# Patient Record
Sex: Male | Born: 1955 | Race: White | Hispanic: No | Marital: Married | Smoking: Never smoker
Health system: Southern US, Community
[De-identification: ages and names within clinical notes are randomized; demographics above are authoritative.]

## PROBLEM LIST (undated history)

## (undated) DIAGNOSIS — G4730 Sleep apnea, unspecified: Secondary | ICD-10-CM

## (undated) DIAGNOSIS — M1990 Unspecified osteoarthritis, unspecified site: Secondary | ICD-10-CM

## (undated) DIAGNOSIS — I10 Essential (primary) hypertension: Secondary | ICD-10-CM

## (undated) HISTORY — PX: KNEE ARTHROSCOPY: SHX127

## (undated) HISTORY — PX: JOINT REPLACEMENT: SHX530

---

## 2005-03-13 ENCOUNTER — Ambulatory Visit: Payer: Self-pay | Admitting: Pain Medicine

## 2005-03-19 ENCOUNTER — Ambulatory Visit: Payer: Self-pay | Admitting: Pain Medicine

## 2005-04-24 ENCOUNTER — Ambulatory Visit: Payer: Self-pay | Admitting: Pain Medicine

## 2005-05-07 ENCOUNTER — Ambulatory Visit: Payer: Self-pay | Admitting: Pain Medicine

## 2005-06-25 ENCOUNTER — Ambulatory Visit: Payer: Self-pay | Admitting: Pain Medicine

## 2005-07-22 ENCOUNTER — Ambulatory Visit: Payer: Self-pay | Admitting: Pain Medicine

## 2005-07-24 ENCOUNTER — Encounter: Admission: RE | Admit: 2005-07-24 | Discharge: 2005-07-24 | Payer: Self-pay | Admitting: Neurosurgery

## 2005-10-28 ENCOUNTER — Ambulatory Visit: Payer: Self-pay | Admitting: Pain Medicine

## 2005-11-03 ENCOUNTER — Ambulatory Visit: Payer: Self-pay | Admitting: Pain Medicine

## 2005-11-18 ENCOUNTER — Ambulatory Visit: Payer: Self-pay | Admitting: Pain Medicine

## 2005-11-24 ENCOUNTER — Ambulatory Visit: Payer: Self-pay | Admitting: Pain Medicine

## 2005-12-11 ENCOUNTER — Ambulatory Visit: Payer: Self-pay | Admitting: Pain Medicine

## 2005-12-15 ENCOUNTER — Ambulatory Visit: Payer: Self-pay | Admitting: Pain Medicine

## 2006-01-15 ENCOUNTER — Ambulatory Visit: Payer: Self-pay | Admitting: Pain Medicine

## 2006-01-21 ENCOUNTER — Ambulatory Visit: Payer: Self-pay | Admitting: Pain Medicine

## 2006-02-12 ENCOUNTER — Ambulatory Visit: Payer: Self-pay | Admitting: Pain Medicine

## 2006-03-17 ENCOUNTER — Ambulatory Visit: Payer: Self-pay | Admitting: Pain Medicine

## 2006-03-25 ENCOUNTER — Ambulatory Visit: Payer: Self-pay | Admitting: Pain Medicine

## 2006-04-16 ENCOUNTER — Ambulatory Visit: Payer: Self-pay | Admitting: Pain Medicine

## 2006-04-27 ENCOUNTER — Ambulatory Visit: Payer: Self-pay | Admitting: Pain Medicine

## 2006-05-20 ENCOUNTER — Ambulatory Visit: Payer: Self-pay | Admitting: Pain Medicine

## 2006-05-26 ENCOUNTER — Ambulatory Visit: Payer: Self-pay | Admitting: Pain Medicine

## 2006-06-15 ENCOUNTER — Ambulatory Visit: Payer: Self-pay | Admitting: Pain Medicine

## 2006-07-15 ENCOUNTER — Emergency Department: Payer: Self-pay | Admitting: Emergency Medicine

## 2006-08-03 ENCOUNTER — Ambulatory Visit: Payer: Self-pay | Admitting: Pain Medicine

## 2006-09-03 ENCOUNTER — Ambulatory Visit: Payer: Self-pay | Admitting: Pain Medicine

## 2012-03-03 ENCOUNTER — Other Ambulatory Visit: Payer: Self-pay | Admitting: Unknown Physician Specialty

## 2012-03-03 DIAGNOSIS — M25561 Pain in right knee: Secondary | ICD-10-CM

## 2012-03-10 ENCOUNTER — Ambulatory Visit
Admission: RE | Admit: 2012-03-10 | Discharge: 2012-03-10 | Disposition: A | Discharge status: Home / Self Care | Payer: 59 | Source: Ambulatory Visit | Attending: Unknown Physician Specialty | Admitting: Unknown Physician Specialty

## 2012-03-10 DIAGNOSIS — M25561 Pain in right knee: Secondary | ICD-10-CM

## 2012-05-17 ENCOUNTER — Ambulatory Visit: Payer: Self-pay | Admitting: Unknown Physician Specialty

## 2014-10-02 DIAGNOSIS — I10 Essential (primary) hypertension: Secondary | ICD-10-CM | POA: Diagnosis not present

## 2014-11-14 DIAGNOSIS — G4733 Obstructive sleep apnea (adult) (pediatric): Secondary | ICD-10-CM | POA: Diagnosis not present

## 2015-02-28 DIAGNOSIS — E785 Hyperlipidemia, unspecified: Secondary | ICD-10-CM | POA: Diagnosis not present

## 2015-02-28 DIAGNOSIS — Z79899 Other long term (current) drug therapy: Secondary | ICD-10-CM | POA: Diagnosis not present

## 2015-03-05 DIAGNOSIS — M545 Low back pain: Secondary | ICD-10-CM | POA: Diagnosis not present

## 2015-03-05 DIAGNOSIS — I10 Essential (primary) hypertension: Secondary | ICD-10-CM | POA: Diagnosis not present

## 2015-03-05 DIAGNOSIS — Z125 Encounter for screening for malignant neoplasm of prostate: Secondary | ICD-10-CM | POA: Diagnosis not present

## 2015-05-15 DIAGNOSIS — G4733 Obstructive sleep apnea (adult) (pediatric): Secondary | ICD-10-CM | POA: Diagnosis not present

## 2015-06-02 DIAGNOSIS — J019 Acute sinusitis, unspecified: Secondary | ICD-10-CM | POA: Diagnosis not present

## 2015-06-02 DIAGNOSIS — R05 Cough: Secondary | ICD-10-CM | POA: Diagnosis not present

## 2015-07-17 DIAGNOSIS — S93402A Sprain of unspecified ligament of left ankle, initial encounter: Secondary | ICD-10-CM | POA: Diagnosis not present

## 2015-07-19 DIAGNOSIS — S93402A Sprain of unspecified ligament of left ankle, initial encounter: Secondary | ICD-10-CM | POA: Diagnosis not present

## 2015-07-26 DIAGNOSIS — M25572 Pain in left ankle and joints of left foot: Secondary | ICD-10-CM | POA: Diagnosis not present

## 2015-07-26 DIAGNOSIS — S93402D Sprain of unspecified ligament of left ankle, subsequent encounter: Secondary | ICD-10-CM | POA: Diagnosis not present

## 2015-09-04 DIAGNOSIS — Z125 Encounter for screening for malignant neoplasm of prostate: Secondary | ICD-10-CM | POA: Diagnosis not present

## 2015-09-04 DIAGNOSIS — I10 Essential (primary) hypertension: Secondary | ICD-10-CM | POA: Diagnosis not present

## 2015-09-14 DIAGNOSIS — Z0000 Encounter for general adult medical examination without abnormal findings: Secondary | ICD-10-CM | POA: Diagnosis not present

## 2015-12-25 DIAGNOSIS — M1712 Unilateral primary osteoarthritis, left knee: Secondary | ICD-10-CM | POA: Diagnosis not present

## 2016-01-31 DIAGNOSIS — M12562 Traumatic arthropathy, left knee: Secondary | ICD-10-CM | POA: Diagnosis not present

## 2016-03-11 DIAGNOSIS — M12562 Traumatic arthropathy, left knee: Secondary | ICD-10-CM | POA: Diagnosis not present

## 2016-03-11 DIAGNOSIS — M1712 Unilateral primary osteoarthritis, left knee: Secondary | ICD-10-CM | POA: Diagnosis not present

## 2016-03-19 DIAGNOSIS — M1712 Unilateral primary osteoarthritis, left knee: Secondary | ICD-10-CM | POA: Diagnosis not present

## 2016-06-30 DIAGNOSIS — M1712 Unilateral primary osteoarthritis, left knee: Secondary | ICD-10-CM | POA: Diagnosis not present

## 2016-07-01 DIAGNOSIS — G4733 Obstructive sleep apnea (adult) (pediatric): Secondary | ICD-10-CM | POA: Diagnosis not present

## 2016-09-10 DIAGNOSIS — H524 Presbyopia: Secondary | ICD-10-CM | POA: Diagnosis not present

## 2016-09-17 DIAGNOSIS — Z79899 Other long term (current) drug therapy: Secondary | ICD-10-CM | POA: Diagnosis not present

## 2016-09-25 DIAGNOSIS — I10 Essential (primary) hypertension: Secondary | ICD-10-CM | POA: Diagnosis not present

## 2016-09-25 DIAGNOSIS — Z6841 Body Mass Index (BMI) 40.0 and over, adult: Secondary | ICD-10-CM | POA: Diagnosis not present

## 2016-09-25 DIAGNOSIS — Z125 Encounter for screening for malignant neoplasm of prostate: Secondary | ICD-10-CM | POA: Diagnosis not present

## 2016-09-25 DIAGNOSIS — M1712 Unilateral primary osteoarthritis, left knee: Secondary | ICD-10-CM | POA: Diagnosis not present

## 2016-09-25 DIAGNOSIS — M545 Low back pain: Secondary | ICD-10-CM | POA: Diagnosis not present

## 2016-09-25 DIAGNOSIS — Z0000 Encounter for general adult medical examination without abnormal findings: Secondary | ICD-10-CM | POA: Diagnosis not present

## 2016-09-25 DIAGNOSIS — E785 Hyperlipidemia, unspecified: Secondary | ICD-10-CM | POA: Diagnosis not present

## 2016-09-25 DIAGNOSIS — G8929 Other chronic pain: Secondary | ICD-10-CM | POA: Diagnosis not present

## 2016-10-02 DIAGNOSIS — Z125 Encounter for screening for malignant neoplasm of prostate: Secondary | ICD-10-CM | POA: Diagnosis not present

## 2016-10-02 DIAGNOSIS — E785 Hyperlipidemia, unspecified: Secondary | ICD-10-CM | POA: Diagnosis not present

## 2016-10-02 DIAGNOSIS — I10 Essential (primary) hypertension: Secondary | ICD-10-CM | POA: Diagnosis not present

## 2016-10-07 NOTE — Patient Instructions (Signed)
  Your procedure is scheduled on: 10-15-16 [facility](WEDNESDAY) Report to Same Day Surgery 2nd floor medical mall [facility](Medical Mall Entrance-take elevator on left to 2nd floor.  Check in with surgery information desk.) To find out your arrival time please call [phone] between 1PM - 3PM on 10-14-16 (TUESDAY)  Remember: Instructions that are not followed completely may result in serious medical risk, up to and including death, or upon the discretion of your surgeon and anesthesiologist your surgery may need to be rescheduled.    _x___ 1. Do not eat food or drink liquids after midnight. No gum chewing or hard candies.     __x__ 2. No Alcohol for 24 hours before or after surgery.   __x__3. No Smoking for 24 prior to surgery.   ____  4. Bring all medications with you on the day of surgery if instructed.    __x__ 5. Notify your doctor if there is any change in your medical condition     (cold, fever, infections).     Do not wear jewelry, make-up, hairpins, clips or nail polish.  Do not wear lotions, powders, or perfumes. You may wear deodorant.  Do not shave 48 hours prior to surgery. Men may shave face and neck.  Do not bring valuables to the hospital.    [facility] not responsible for any belongings or valuables.               Contacts, dentures or bridgework may not be worn into surgery.  Leave your suitcase in the car. After surgery it may be brought to your room.  For patients admitted to the hospital, discharge time is determined by your treatment team.   Patients discharged the day of surgery will not be allowed to drive home.  You will need someone to drive you home and stay with you the night of your procedure.    Please read over the following fact sheets that you were given:   [facility] Pre[facility] and or MRSA Information   _x___ Take these medicines the morning of surgery with A SIP OF WATER:    1. METHADONE  2.  3.  4.  5.  6.  ____Fleets enema or Magnesium  Citrate as directed.   _x___ Use CHG Soap or sage wipes as directed on instruction sheet   ____ Use inhalers on the day of surgery and bring to hospital day of surgery  ____ Stop metformin 2 days prior to surgery    ____ Take 1/2 of usual insulin dose the night before surgery and none on the morning of   surgery.   ____ Stop Aspirin, Coumadin, Pllavix ,Eliquis, Effient, or Pradaxa  x__ Stop Anti-inflammatories such as Advil, Aleve, Ibuprofen, Motrin, Naproxen,          Naprosyn, Goodies powders or aspirin products NOW-Ok to take Tylenol.   ____ Stop supplements until after surgery.    _X___ Bring C-Pap to the hospital.

## 2016-10-09 ENCOUNTER — Encounter
Admission: RE | Admit: 2016-10-09 | Discharge: 2016-10-09 | Disposition: A | Discharge status: Home / Self Care | Payer: Medicare HMO | Source: Ambulatory Visit | Attending: Unknown Physician Specialty | Admitting: Unknown Physician Specialty

## 2016-10-09 DIAGNOSIS — I10 Essential (primary) hypertension: Secondary | ICD-10-CM | POA: Insufficient documentation

## 2016-10-09 HISTORY — DX: Unspecified osteoarthritis, unspecified site: M19.90

## 2016-10-09 HISTORY — DX: Sleep apnea, unspecified: G47.30

## 2016-10-09 HISTORY — DX: Essential (primary) hypertension: I10

## 2016-10-15 ENCOUNTER — Ambulatory Visit: Payer: Medicare HMO | Admitting: Anesthesiology

## 2016-10-15 ENCOUNTER — Encounter
Admission: RE | Disposition: A | Discharge status: Home / Self Care | Payer: Self-pay | Source: Ambulatory Visit | Attending: Unknown Physician Specialty

## 2016-10-15 ENCOUNTER — Encounter: Payer: Self-pay | Admitting: *Deleted

## 2016-10-15 ENCOUNTER — Ambulatory Visit
Admission: RE | Admit: 2016-10-15 | Discharge: 2016-10-15 | Disposition: A | Discharge status: Home / Self Care | Payer: Medicare HMO | Source: Ambulatory Visit | Attending: Unknown Physician Specialty | Admitting: Unknown Physician Specialty

## 2016-10-15 DIAGNOSIS — Z96651 Presence of right artificial knee joint: Secondary | ICD-10-CM | POA: Insufficient documentation

## 2016-10-15 DIAGNOSIS — Z472 Encounter for removal of internal fixation device: Secondary | ICD-10-CM | POA: Insufficient documentation

## 2016-10-15 DIAGNOSIS — G4730 Sleep apnea, unspecified: Secondary | ICD-10-CM | POA: Insufficient documentation

## 2016-10-15 DIAGNOSIS — I10 Essential (primary) hypertension: Secondary | ICD-10-CM | POA: Diagnosis not present

## 2016-10-15 DIAGNOSIS — Z6841 Body Mass Index (BMI) 40.0 and over, adult: Secondary | ICD-10-CM | POA: Diagnosis not present

## 2016-10-15 DIAGNOSIS — S82122S Displaced fracture of lateral condyle of left tibia, sequela: Secondary | ICD-10-CM | POA: Diagnosis not present

## 2016-10-15 DIAGNOSIS — M1712 Unilateral primary osteoarthritis, left knee: Secondary | ICD-10-CM | POA: Insufficient documentation

## 2016-10-15 HISTORY — PX: HARDWARE REMOVAL: SHX979

## 2016-10-15 SURGERY — REMOVAL, HARDWARE
Anesthesia: Spinal | Site: Knee | Laterality: Left

## 2016-10-15 MED ORDER — NEOMYCIN-POLYMYXIN B GU 40-200000 IR SOLN
Status: DC | PRN
  Administered 2016-10-15: 2 mL

## 2016-10-15 MED ORDER — PROPOFOL 500 MG/50ML IV EMUL
INTRAVENOUS | Status: DC | PRN
  Administered 2016-10-15: 25 ug/kg/min via INTRAVENOUS

## 2016-10-15 MED ORDER — ONDANSETRON HCL 4 MG/2ML IJ SOLN
INTRAMUSCULAR | Status: DC | PRN
  Administered 2016-10-15: 4 mg via INTRAVENOUS

## 2016-10-15 MED ORDER — OXYCODONE HCL 10 MG PO TABS: 10.0000 mg | ORAL_TABLET | Freq: Four times a day (QID) | ORAL | 0 refills | Status: DC | PRN

## 2016-10-15 MED ORDER — CEFAZOLIN SODIUM 1 G IJ SOLR
INTRAMUSCULAR | Status: DC | PRN
  Administered 2016-10-15: 2 g

## 2016-10-15 MED ORDER — PROPOFOL 10 MG/ML IV BOLUS
INTRAVENOUS | Status: AC
  Filled 2016-10-15: qty 20

## 2016-10-15 MED ORDER — LIDOCAINE HCL (CARDIAC) 20 MG/ML IV SOLN
INTRAVENOUS | Status: DC | PRN
  Administered 2016-10-15: 20 mg via INTRAVENOUS

## 2016-10-15 MED ORDER — ONDANSETRON HCL 4 MG/2ML IJ SOLN: 4.0000 mg | Freq: Once | INTRAMUSCULAR | Status: DC | PRN

## 2016-10-15 MED ORDER — SEVOFLURANE IN SOLN
RESPIRATORY_TRACT | Status: AC
  Filled 2016-10-15: qty 250

## 2016-10-15 MED ORDER — DEXAMETHASONE SODIUM PHOSPHATE 10 MG/ML IJ SOLN
INTRAMUSCULAR | Status: DC | PRN
  Administered 2016-10-15: 5 mg via INTRAVENOUS

## 2016-10-15 MED ORDER — BUPIVACAINE HCL (PF) 0.5 % IJ SOLN
INTRAMUSCULAR | Status: DC | PRN
  Administered 2016-10-15: 1.5 mL via INTRATHECAL

## 2016-10-15 MED ORDER — NEOMYCIN-POLYMYXIN B GU 40-200000 IR SOLN
Status: AC
  Filled 2016-10-15: qty 2

## 2016-10-15 MED ORDER — LIDOCAINE HCL (PF) 2 % IJ SOLN
INTRAMUSCULAR | Status: AC
  Filled 2016-10-15: qty 2

## 2016-10-15 MED ORDER — DEXAMETHASONE SODIUM PHOSPHATE 10 MG/ML IJ SOLN
INTRAMUSCULAR | Status: AC
  Filled 2016-10-15: qty 1

## 2016-10-15 MED ORDER — CEFAZOLIN SODIUM 1 G IJ SOLR
INTRAMUSCULAR | Status: AC
  Filled 2016-10-15: qty 10

## 2016-10-15 MED ORDER — PROPOFOL 10 MG/ML IV BOLUS
INTRAVENOUS | Status: DC | PRN
  Administered 2016-10-15 (×2): 20 mg via INTRAVENOUS

## 2016-10-15 MED ORDER — FENTANYL CITRATE (PF) 100 MCG/2ML IJ SOLN
INTRAMUSCULAR | Status: AC
  Filled 2016-10-15: qty 2

## 2016-10-15 MED ORDER — BUPIVACAINE HCL (PF) 0.5 % IJ SOLN
INTRAMUSCULAR | Status: DC | PRN
  Administered 2016-10-15: 10 mL

## 2016-10-15 MED ORDER — LACTATED RINGERS IV SOLN
INTRAVENOUS | Status: DC
  Administered 2016-10-15: 07:00:00 via INTRAVENOUS

## 2016-10-15 MED ORDER — GLYCOPYRROLATE 0.2 MG/ML IJ SOLN
INTRAMUSCULAR | Status: AC
Start: 2016-10-15 — End: 2016-10-15
  Filled 2016-10-15: qty 1

## 2016-10-15 MED ORDER — FENTANYL CITRATE (PF) 100 MCG/2ML IJ SOLN
INTRAMUSCULAR | Status: DC | PRN
  Administered 2016-10-15: 100 ug via INTRAVENOUS

## 2016-10-15 MED ORDER — GLYCOPYRROLATE 0.2 MG/ML IJ SOLN
INTRAMUSCULAR | Status: DC | PRN
  Administered 2016-10-15: .2 mg via INTRAVENOUS

## 2016-10-15 MED ORDER — BUPIVACAINE IN DEXTROSE 0.75-8.25 % IT SOLN
INTRATHECAL | Status: DC | PRN
  Administered 2016-10-15: 1.2 mL via INTRATHECAL

## 2016-10-15 MED ORDER — MIDAZOLAM HCL 5 MG/5ML IJ SOLN
INTRAMUSCULAR | Status: DC | PRN
  Administered 2016-10-15: 2 mg via INTRAVENOUS

## 2016-10-15 MED ORDER — FENTANYL CITRATE (PF) 100 MCG/2ML IJ SOLN: 25.0000 ug | INTRAMUSCULAR | Status: DC | PRN

## 2016-10-15 MED ORDER — MIDAZOLAM HCL 2 MG/2ML IJ SOLN
INTRAMUSCULAR | Status: AC
  Filled 2016-10-15: qty 2

## 2016-10-15 MED ORDER — FAMOTIDINE 20 MG PO TABS
20.0000 mg | ORAL_TABLET | Freq: Once | ORAL | Status: AC
  Administered 2016-10-15: 20 mg via ORAL

## 2016-10-15 MED ORDER — BUPIVACAINE HCL (PF) 0.5 % IJ SOLN
INTRAMUSCULAR | Status: AC
  Filled 2016-10-15: qty 30

## 2016-10-15 SURGICAL SUPPLY — 47 items
BLADE SURG SZ10 CARB STEEL (BLADE) ×2 IMPLANT
BNDG ESMARK 6X12 TAN STRL LF (GAUZE/BANDAGES/DRESSINGS) ×1 IMPLANT
CANISTER SUCT 1200ML W/VALVE (MISCELLANEOUS) ×2 IMPLANT
CUFF TOURN 30 STER DUAL PORT (MISCELLANEOUS) IMPLANT
CUFF TOURN 34 STER (MISCELLANEOUS) ×1 IMPLANT
DRAPE FLUOR MINI C-ARM 54X84 (DRAPES) ×1 IMPLANT
DRAPE INCISE IOBAN 66X45 STRL (DRAPES) ×1 IMPLANT
DRAPE U-SHAPE 47X51 STRL (DRAPES) ×1 IMPLANT
DURAPREP 26ML APPLICATOR (WOUND CARE) ×3 IMPLANT
GAUZE PETRO XEROFOAM 1X8 (MISCELLANEOUS) ×1 IMPLANT
GAUZE SPONGE 4X4 12PLY STRL (GAUZE/BANDAGES/DRESSINGS) ×2 IMPLANT
GAUZE XEROFORM 4X4 STRL (GAUZE/BANDAGES/DRESSINGS) ×1 IMPLANT
GLOVE BIO SURGEON STRL SZ7 (GLOVE) ×3 IMPLANT
GLOVE BIO SURGEON STRL SZ7.5 (GLOVE) ×1 IMPLANT
GLOVE BIOGEL M STRL SZ7.5 (GLOVE) ×2 IMPLANT
GLOVE INDICATOR 8.0 STRL GRN (GLOVE) ×2 IMPLANT
GLOVE SURG ORTHO 8.0 STRL STRW (GLOVE) ×2 IMPLANT
GOWN STRL REUS W/ TWL LRG LVL3 (GOWN DISPOSABLE) ×1 IMPLANT
GOWN STRL REUS W/TWL LRG LVL3 (GOWN DISPOSABLE) ×2
GOWN STRL REUS W/TWL LRG LVL4 (GOWN DISPOSABLE) ×2 IMPLANT
HANDLE YANKAUER SUCT BULB TIP (MISCELLANEOUS) ×1 IMPLANT
KIT RM TURNOVER STRD PROC AR (KITS) ×2 IMPLANT
NDL FILTER BLUNT 18X1 1/2 (NEEDLE) IMPLANT
NEEDLE FILTER BLUNT 18X 1/2SAF (NEEDLE) ×1
NEEDLE FILTER BLUNT 18X1 1/2 (NEEDLE) ×1 IMPLANT
NS IRRIG 500ML POUR BTL (IV SOLUTION) ×2 IMPLANT
PACK EXTREMITY ARMC (MISCELLANEOUS) ×2 IMPLANT
PAD ABD DERMACEA PRESS 5X9 (GAUZE/BANDAGES/DRESSINGS) ×3 IMPLANT
PAD CAST CTTN 4X4 STRL (SOFTGOODS) ×1 IMPLANT
PAD GROUND ADULT SPLIT (MISCELLANEOUS) ×2 IMPLANT
PADDING CAST COTTON 4X4 STRL (SOFTGOODS) ×4
SOL PREP PVP 2OZ (MISCELLANEOUS) ×2
SOLUTION PREP PVP 2OZ (MISCELLANEOUS) ×1 IMPLANT
SPONGE LAP 18X18 5 PK (GAUZE/BANDAGES/DRESSINGS) ×1 IMPLANT
STAPLER SKIN PROX 35W (STAPLE) ×1 IMPLANT
STOCKINETTE BIAS CUT 6 980064 (GAUZE/BANDAGES/DRESSINGS) ×2 IMPLANT
STOCKINETTE STRL 6IN 960660 (GAUZE/BANDAGES/DRESSINGS) ×2 IMPLANT
SUT ETHILON 3-0 FS-10 30 BLK (SUTURE) ×2
SUT VIC AB 0 CT1 36 (SUTURE) ×2 IMPLANT
SUT VIC AB 2-0 CT1 27 (SUTURE) ×2
SUT VIC AB 2-0 CT1 TAPERPNT 27 (SUTURE) IMPLANT
SUT VIC AB 2-0 SH 27 (SUTURE) ×2
SUT VIC AB 2-0 SH 27XBRD (SUTURE) ×1 IMPLANT
SUTURE EHLN 3-0 FS-10 30 BLK (SUTURE) ×1 IMPLANT
SYR 3ML LL SCALE MARK (SYRINGE) ×1 IMPLANT
TAPE MICROFOAM 4IN (TAPE) ×1 IMPLANT
WRAP KNEE W/COLD PACKS 25.5X14 (SOFTGOODS) ×1 IMPLANT

## 2016-10-15 NOTE — Anesthesia Procedure Notes (Signed)
Spinal  Patient location during procedure: OR Staffing Performed: anesthesiologist  Preanesthetic Checklist Completed: patient identified, site marked, surgical consent, pre-op evaluation, timeout performed, IV checked, risks and benefits discussed and monitors and equipment checked Spinal Block Patient position: sitting Prep: Betadine Patient monitoring: heart rate, continuous pulse ox, blood pressure and cardiac monitor Approach: midline Location: L4-5 Injection technique: single-shot Needle Needle type: Whitacre and Introducer  Needle gauge: 24 G Needle length: 10 cm Assessment Sensory level: T6 Additional Notes Negative paresthesia. Negative blood return. Positive free-flowing CSF. Expiration date of kit checked and confirmed. Patient tolerated procedure well, without complications.

## 2016-10-15 NOTE — Transfer of Care (Signed)
Immediate Anesthesia Transfer of Care Note  Patient: Thomas Pruitt  Procedure(s) Performed: Procedure(s): HARDWARE REMOVAL (Left)  Patient Location: PACU  Anesthesia Type:General and Spinal  Level of Consciousness: awake, alert  and oriented  Airway & Oxygen Therapy: Patient Spontanous Breathing and Patient connected to face mask oxygen  Post-op Assessment: Report given to RN, Post -op Vital signs reviewed and stable and Patient moving all extremities X 4  Post vital signs: Reviewed and stable  Last Vitals:  Vitals:   10/15/16 0656 10/15/16 0958  BP: 116/78   Pulse: (!) 103   Resp: 16   Temp: 36.5 C 36.4 C    Last Pain:  Vitals:   10/15/16 0958  TempSrc: Temporal  PainSc:          Complications: No apparent anesthesia complications

## 2016-10-15 NOTE — OR Nursing (Signed)
Patient here for hardware removal from left knee. Explanted four screws and one plate from left knee.

## 2016-10-15 NOTE — Anesthesia Post-op Follow-up Note (Cosign Needed)
Anesthesia QCDR form completed.        

## 2016-10-15 NOTE — Op Note (Signed)
10/15/2016  2:34 PM  PATIENT:  Thomas Pruitt  61 y.o. 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 Over time the patient had developed fairly severe tricompartmental arthritis. Patient was brought in for removal of his osteotomy fixation device in preparation for a future left total knee replacement.  OP NOTE: The patient was taken the operating room where satisfactory spinal anesthesia was achieved. Patient was placed in a supine position. Tourniquet was applied to his left proximal thigh. The left lower extremity was then prepped and draped in usual fashion for procedure about the knee. The patient was given 2 g of Kefzol IV shortly after the start of the procedure. The left lower extremity was exsanguinated and the tourniquet was inflated. The patient's remote anterolateral proximal tibial incision was re-incised. Dissection was carried down through the soft tissue onto the L-shaped osteotomy plate. I was able to use an osteotome to remove some bony overgrowth. I was then able to remove the proximal and distal screws. The plate was then elevated off of the lateral proximal tibia. I closed the proximal anterolateral muscle fascia with #1 Vicryl sutures. The tourniquet was released at this time. It had been up about 59 minutes. There was minimal bleeding. The subcutaneous tissue was closed with 2-0 Vicryl. The skin was closed with skin staples. I applied a sterile compression dressing. The patient was then transferred to his stretcher bed. He was taken to the recovery room in satisfactory condition.

## 2016-10-15 NOTE — Discharge Instructions (Addendum)
AMBULATORY SURGERY  DISCHARGE INSTRUCTIONS   1) The drugs that you were given will stay in your system until tomorrow so for the next 24 hours you should not:  A) Drive an automobile B) Make any legal decisions C) Drink any alcoholic beverage   2) You may resume regular meals tomorrow.  Today it is better to start with liquids and gradually work up to solid foods.  You may eat anything you prefer, but it is better to start with liquids, then soup and crackers, and gradually work up to solid foods.   3) Please notify your doctor immediately if you have any unusual bleeding, trouble breathing, redness and pain at the surgery site, drainage, fever, or pain not relieved by medication.  4) Your post-operative visit with Dr.                                     is: Date:                        Time:    Please call to schedule your post-operative visit.  5) Additional Instructions: Ice pack  Elevation  WBAT with cane or crutch  RTC in about 1 wk.   Keep dressing dry

## 2016-10-15 NOTE — Anesthesia Procedure Notes (Signed)
Date/Time: 10/15/2016 8:05 AM Performed by: Derinda LateIACONE, Sholom Dulude Pre-anesthesia Checklist: Timeout performed, Patient being monitored, Suction available, Emergency Drugs available and Patient identified Patient Re-evaluated:Patient Re-evaluated prior to inductionOxygen Delivery Method: Simple face mask Preoxygenation: Pre-oxygenation with 100% oxygen Placement Confirmation: positive ETCO2

## 2016-10-15 NOTE — OR Nursing (Signed)
Bladder scanner shows 434 to 456 ml of urine - pt stood up  And urine began leaking on to floor - sheet was wet under pt.

## 2016-10-15 NOTE — Anesthesia Preprocedure Evaluation (Signed)
Anesthesia Evaluation  Patient identified by MRN, date of birth, ID band Patient awake    Reviewed: Allergy & Precautions, H&P , NPO status , Patient's Chart, lab work & pertinent test results, reviewed documented beta blocker date and time   Airway Mallampati: III   Neck ROM: full    Dental  (+) Poor Dentition, Teeth Intact   Pulmonary neg pulmonary ROS, sleep apnea ,    Pulmonary exam normal        Cardiovascular hypertension, negative cardio ROS Normal cardiovascular exam Rhythm:regular Rate:Normal     Neuro/Psych negative neurological ROS  negative psych ROS   GI/Hepatic negative GI ROS, Neg liver ROS,   Endo/Other  negative endocrine ROSMorbid obesity  Renal/GU negative Renal ROS  negative genitourinary   Musculoskeletal   Abdominal   Peds  Hematology negative hematology ROS (+)   Anesthesia Other Findings Past Medical History: No date: Arthritis No date: Hypertension No date: Sleep apnea     Comment: CPAP Past Surgical History: No date: JOINT REPLACEMENT Right     Comment: PARTIAL No date: KNEE ARTHROSCOPY Left     Comment: X2 BMI    Body Mass Index:  44.92 kg/m     Reproductive/Obstetrics negative OB ROS                             Anesthesia Physical Anesthesia Plan  ASA: III  Anesthesia Plan: Spinal   Post-op Pain Management:    Induction:   Airway Management Planned:   Additional Equipment:   Intra-op Plan:   Post-operative Plan:   Informed Consent: I have reviewed the patients History and Physical, chart, labs and discussed the procedure including the risks, benefits and alternatives for the proposed anesthesia with the patient or authorized representative who has indicated his/her understanding and acceptance.   Dental Advisory Given  Plan Discussed with: CRNA  Anesthesia Plan Comments:         Anesthesia Quick Evaluation

## 2016-10-15 NOTE — OR Nursing (Signed)
Iv dcd left hand for discharge - site without redness catheter intact

## 2016-10-15 NOTE — H&P (Signed)
  H and P reviewed. No changes. Uploaded at later date. 

## 2016-10-16 NOTE — Anesthesia Postprocedure Evaluation (Signed)
Anesthesia Post Note  Patient: Thomas Pruitt  Procedure(s) Performed: Procedure(s) (LRB): HARDWARE REMOVAL (Left)  Patient location during evaluation: Nursing Unit Anesthesia Type: Spinal Level of consciousness: awake, awake and alert and oriented Pain management: pain level controlled Vital Signs Assessment: post-procedure vital signs reviewed and stable Respiratory status: spontaneous breathing, nonlabored ventilation and respiratory function stable Cardiovascular status: blood pressure returned to baseline and stable Postop Assessment: no headache and no backache Anesthetic complications: no     Last Vitals:  Vitals:   10/15/16 1256 10/15/16 1405  BP: 138/86 128/85  Pulse: 67 67  Resp: 16 16  Temp:      Last Pain:  Vitals:   10/15/16 1038  TempSrc: Tympanic  PainSc:                  Lyn RecordsNoles,  Thomas Ringel R

## 2017-01-05 DIAGNOSIS — Z01812 Encounter for preprocedural laboratory examination: Secondary | ICD-10-CM | POA: Diagnosis not present

## 2017-01-05 DIAGNOSIS — Z01818 Encounter for other preprocedural examination: Secondary | ICD-10-CM | POA: Diagnosis not present

## 2017-01-05 DIAGNOSIS — M1712 Unilateral primary osteoarthritis, left knee: Secondary | ICD-10-CM | POA: Diagnosis not present

## 2017-01-09 DIAGNOSIS — M1712 Unilateral primary osteoarthritis, left knee: Secondary | ICD-10-CM | POA: Diagnosis not present

## 2017-01-19 DIAGNOSIS — Z79891 Long term (current) use of opiate analgesic: Secondary | ICD-10-CM | POA: Diagnosis not present

## 2017-01-19 DIAGNOSIS — G8918 Other acute postprocedural pain: Secondary | ICD-10-CM | POA: Diagnosis not present

## 2017-01-19 DIAGNOSIS — H1189 Other specified disorders of conjunctiva: Secondary | ICD-10-CM | POA: Diagnosis not present

## 2017-01-19 DIAGNOSIS — M1712 Unilateral primary osteoarthritis, left knee: Secondary | ICD-10-CM | POA: Diagnosis not present

## 2017-01-19 DIAGNOSIS — I10 Essential (primary) hypertension: Secondary | ICD-10-CM | POA: Diagnosis not present

## 2017-01-19 DIAGNOSIS — N400 Enlarged prostate without lower urinary tract symptoms: Secondary | ICD-10-CM | POA: Diagnosis not present

## 2017-01-19 DIAGNOSIS — G4700 Insomnia, unspecified: Secondary | ICD-10-CM | POA: Diagnosis not present

## 2017-01-19 DIAGNOSIS — M25562 Pain in left knee: Secondary | ICD-10-CM | POA: Diagnosis not present

## 2017-01-19 DIAGNOSIS — G4733 Obstructive sleep apnea (adult) (pediatric): Secondary | ICD-10-CM | POA: Diagnosis not present

## 2017-01-23 DIAGNOSIS — G8929 Other chronic pain: Secondary | ICD-10-CM | POA: Diagnosis not present

## 2017-01-23 DIAGNOSIS — M549 Dorsalgia, unspecified: Secondary | ICD-10-CM | POA: Diagnosis not present

## 2017-01-23 DIAGNOSIS — M1991 Primary osteoarthritis, unspecified site: Secondary | ICD-10-CM | POA: Diagnosis not present

## 2017-01-23 DIAGNOSIS — Z471 Aftercare following joint replacement surgery: Secondary | ICD-10-CM | POA: Diagnosis not present

## 2017-01-23 DIAGNOSIS — I10 Essential (primary) hypertension: Secondary | ICD-10-CM | POA: Diagnosis not present

## 2017-01-23 DIAGNOSIS — Z96652 Presence of left artificial knee joint: Secondary | ICD-10-CM | POA: Diagnosis not present

## 2017-01-26 DIAGNOSIS — Z471 Aftercare following joint replacement surgery: Secondary | ICD-10-CM | POA: Diagnosis not present

## 2017-01-26 DIAGNOSIS — G8929 Other chronic pain: Secondary | ICD-10-CM | POA: Diagnosis not present

## 2017-01-26 DIAGNOSIS — Z96652 Presence of left artificial knee joint: Secondary | ICD-10-CM | POA: Diagnosis not present

## 2017-01-26 DIAGNOSIS — M1991 Primary osteoarthritis, unspecified site: Secondary | ICD-10-CM | POA: Diagnosis not present

## 2017-01-26 DIAGNOSIS — M549 Dorsalgia, unspecified: Secondary | ICD-10-CM | POA: Diagnosis not present

## 2017-01-26 DIAGNOSIS — I10 Essential (primary) hypertension: Secondary | ICD-10-CM | POA: Diagnosis not present

## 2017-01-28 DIAGNOSIS — G8929 Other chronic pain: Secondary | ICD-10-CM | POA: Diagnosis not present

## 2017-01-28 DIAGNOSIS — M549 Dorsalgia, unspecified: Secondary | ICD-10-CM | POA: Diagnosis not present

## 2017-01-28 DIAGNOSIS — I10 Essential (primary) hypertension: Secondary | ICD-10-CM | POA: Diagnosis not present

## 2017-01-28 DIAGNOSIS — Z471 Aftercare following joint replacement surgery: Secondary | ICD-10-CM | POA: Diagnosis not present

## 2017-01-28 DIAGNOSIS — M1991 Primary osteoarthritis, unspecified site: Secondary | ICD-10-CM | POA: Diagnosis not present

## 2017-01-28 DIAGNOSIS — Z96652 Presence of left artificial knee joint: Secondary | ICD-10-CM | POA: Diagnosis not present

## 2017-01-30 DIAGNOSIS — M1991 Primary osteoarthritis, unspecified site: Secondary | ICD-10-CM | POA: Diagnosis not present

## 2017-01-30 DIAGNOSIS — Z471 Aftercare following joint replacement surgery: Secondary | ICD-10-CM | POA: Diagnosis not present

## 2017-01-30 DIAGNOSIS — M549 Dorsalgia, unspecified: Secondary | ICD-10-CM | POA: Diagnosis not present

## 2017-01-30 DIAGNOSIS — G8929 Other chronic pain: Secondary | ICD-10-CM | POA: Diagnosis not present

## 2017-01-30 DIAGNOSIS — Z96652 Presence of left artificial knee joint: Secondary | ICD-10-CM | POA: Diagnosis not present

## 2017-01-30 DIAGNOSIS — I10 Essential (primary) hypertension: Secondary | ICD-10-CM | POA: Diagnosis not present

## 2017-02-03 DIAGNOSIS — M1991 Primary osteoarthritis, unspecified site: Secondary | ICD-10-CM | POA: Diagnosis not present

## 2017-02-03 DIAGNOSIS — Z96652 Presence of left artificial knee joint: Secondary | ICD-10-CM | POA: Diagnosis not present

## 2017-02-03 DIAGNOSIS — I10 Essential (primary) hypertension: Secondary | ICD-10-CM | POA: Diagnosis not present

## 2017-02-03 DIAGNOSIS — Z471 Aftercare following joint replacement surgery: Secondary | ICD-10-CM | POA: Diagnosis not present

## 2017-02-03 DIAGNOSIS — G8929 Other chronic pain: Secondary | ICD-10-CM | POA: Diagnosis not present

## 2017-02-03 DIAGNOSIS — M549 Dorsalgia, unspecified: Secondary | ICD-10-CM | POA: Diagnosis not present

## 2017-02-04 DIAGNOSIS — Z471 Aftercare following joint replacement surgery: Secondary | ICD-10-CM | POA: Diagnosis not present

## 2017-02-04 DIAGNOSIS — G8929 Other chronic pain: Secondary | ICD-10-CM | POA: Diagnosis not present

## 2017-02-04 DIAGNOSIS — M549 Dorsalgia, unspecified: Secondary | ICD-10-CM | POA: Diagnosis not present

## 2017-02-04 DIAGNOSIS — I10 Essential (primary) hypertension: Secondary | ICD-10-CM | POA: Diagnosis not present

## 2017-02-04 DIAGNOSIS — Z96652 Presence of left artificial knee joint: Secondary | ICD-10-CM | POA: Diagnosis not present

## 2017-02-04 DIAGNOSIS — M1991 Primary osteoarthritis, unspecified site: Secondary | ICD-10-CM | POA: Diagnosis not present

## 2017-02-07 DIAGNOSIS — Z471 Aftercare following joint replacement surgery: Secondary | ICD-10-CM | POA: Diagnosis not present

## 2017-02-07 DIAGNOSIS — G8929 Other chronic pain: Secondary | ICD-10-CM | POA: Diagnosis not present

## 2017-02-07 DIAGNOSIS — I10 Essential (primary) hypertension: Secondary | ICD-10-CM | POA: Diagnosis not present

## 2017-02-07 DIAGNOSIS — Z96652 Presence of left artificial knee joint: Secondary | ICD-10-CM | POA: Diagnosis not present

## 2017-02-07 DIAGNOSIS — M549 Dorsalgia, unspecified: Secondary | ICD-10-CM | POA: Diagnosis not present

## 2017-02-07 DIAGNOSIS — M1991 Primary osteoarthritis, unspecified site: Secondary | ICD-10-CM | POA: Diagnosis not present

## 2017-02-09 DIAGNOSIS — G8929 Other chronic pain: Secondary | ICD-10-CM | POA: Diagnosis not present

## 2017-02-09 DIAGNOSIS — Z471 Aftercare following joint replacement surgery: Secondary | ICD-10-CM | POA: Diagnosis not present

## 2017-02-09 DIAGNOSIS — M1991 Primary osteoarthritis, unspecified site: Secondary | ICD-10-CM | POA: Diagnosis not present

## 2017-02-09 DIAGNOSIS — I10 Essential (primary) hypertension: Secondary | ICD-10-CM | POA: Diagnosis not present

## 2017-02-09 DIAGNOSIS — Z96652 Presence of left artificial knee joint: Secondary | ICD-10-CM | POA: Diagnosis not present

## 2017-02-09 DIAGNOSIS — M549 Dorsalgia, unspecified: Secondary | ICD-10-CM | POA: Diagnosis not present

## 2017-02-11 DIAGNOSIS — Z471 Aftercare following joint replacement surgery: Secondary | ICD-10-CM | POA: Diagnosis not present

## 2017-02-11 DIAGNOSIS — M549 Dorsalgia, unspecified: Secondary | ICD-10-CM | POA: Diagnosis not present

## 2017-02-11 DIAGNOSIS — Z96652 Presence of left artificial knee joint: Secondary | ICD-10-CM | POA: Diagnosis not present

## 2017-02-11 DIAGNOSIS — M1991 Primary osteoarthritis, unspecified site: Secondary | ICD-10-CM | POA: Diagnosis not present

## 2017-02-11 DIAGNOSIS — I10 Essential (primary) hypertension: Secondary | ICD-10-CM | POA: Diagnosis not present

## 2017-02-11 DIAGNOSIS — G8929 Other chronic pain: Secondary | ICD-10-CM | POA: Diagnosis not present

## 2017-02-13 DIAGNOSIS — G8929 Other chronic pain: Secondary | ICD-10-CM | POA: Diagnosis not present

## 2017-02-13 DIAGNOSIS — Z471 Aftercare following joint replacement surgery: Secondary | ICD-10-CM | POA: Diagnosis not present

## 2017-02-13 DIAGNOSIS — Z96652 Presence of left artificial knee joint: Secondary | ICD-10-CM | POA: Diagnosis not present

## 2017-02-13 DIAGNOSIS — M549 Dorsalgia, unspecified: Secondary | ICD-10-CM | POA: Diagnosis not present

## 2017-02-13 DIAGNOSIS — I10 Essential (primary) hypertension: Secondary | ICD-10-CM | POA: Diagnosis not present

## 2017-02-13 DIAGNOSIS — M1991 Primary osteoarthritis, unspecified site: Secondary | ICD-10-CM | POA: Diagnosis not present

## 2017-02-17 DIAGNOSIS — Z96652 Presence of left artificial knee joint: Secondary | ICD-10-CM | POA: Diagnosis not present

## 2017-03-02 ENCOUNTER — Ambulatory Visit
Admission: RE | Admit: 2017-03-02 | Discharge: 2017-03-02 | Disposition: A | Discharge status: Home / Self Care | Payer: Medicare HMO | Source: Ambulatory Visit | Attending: Unknown Physician Specialty | Admitting: Unknown Physician Specialty

## 2017-03-02 ENCOUNTER — Other Ambulatory Visit: Payer: Self-pay | Admitting: Unknown Physician Specialty

## 2017-03-02 DIAGNOSIS — M7989 Other specified soft tissue disorders: Secondary | ICD-10-CM

## 2017-03-02 DIAGNOSIS — M79605 Pain in left leg: Secondary | ICD-10-CM

## 2017-03-02 DIAGNOSIS — Z96651 Presence of right artificial knee joint: Secondary | ICD-10-CM | POA: Diagnosis not present

## 2017-03-02 DIAGNOSIS — Z471 Aftercare following joint replacement surgery: Secondary | ICD-10-CM | POA: Diagnosis not present

## 2017-04-10 DIAGNOSIS — M545 Low back pain: Secondary | ICD-10-CM | POA: Diagnosis not present

## 2017-04-10 DIAGNOSIS — I10 Essential (primary) hypertension: Secondary | ICD-10-CM | POA: Diagnosis not present

## 2017-04-10 DIAGNOSIS — G8929 Other chronic pain: Secondary | ICD-10-CM | POA: Diagnosis not present

## 2017-05-19 DIAGNOSIS — Z96652 Presence of left artificial knee joint: Secondary | ICD-10-CM | POA: Diagnosis not present

## 2017-05-19 DIAGNOSIS — G8929 Other chronic pain: Secondary | ICD-10-CM | POA: Diagnosis not present

## 2017-05-19 DIAGNOSIS — M545 Low back pain: Secondary | ICD-10-CM | POA: Diagnosis not present

## 2017-10-15 DIAGNOSIS — I10 Essential (primary) hypertension: Secondary | ICD-10-CM | POA: Diagnosis not present

## 2017-10-15 DIAGNOSIS — Z6841 Body Mass Index (BMI) 40.0 and over, adult: Secondary | ICD-10-CM | POA: Diagnosis not present

## 2017-10-15 DIAGNOSIS — J309 Allergic rhinitis, unspecified: Secondary | ICD-10-CM | POA: Diagnosis not present

## 2017-10-15 DIAGNOSIS — E785 Hyperlipidemia, unspecified: Secondary | ICD-10-CM | POA: Diagnosis not present

## 2017-10-15 DIAGNOSIS — M545 Low back pain: Secondary | ICD-10-CM | POA: Diagnosis not present

## 2017-10-15 DIAGNOSIS — Z0000 Encounter for general adult medical examination without abnormal findings: Secondary | ICD-10-CM | POA: Diagnosis not present

## 2017-10-15 DIAGNOSIS — G8929 Other chronic pain: Secondary | ICD-10-CM | POA: Diagnosis not present

## 2017-10-15 DIAGNOSIS — Z125 Encounter for screening for malignant neoplasm of prostate: Secondary | ICD-10-CM | POA: Diagnosis not present

## 2017-10-21 DIAGNOSIS — E785 Hyperlipidemia, unspecified: Secondary | ICD-10-CM | POA: Diagnosis not present

## 2017-10-21 DIAGNOSIS — I10 Essential (primary) hypertension: Secondary | ICD-10-CM | POA: Diagnosis not present

## 2017-10-21 DIAGNOSIS — Z125 Encounter for screening for malignant neoplasm of prostate: Secondary | ICD-10-CM | POA: Diagnosis not present

## 2017-11-02 IMAGING — US US EXTREM LOW VENOUS*L*
1 series · 13 of 24 positions shown · non-contrast
Comparison: None

CLINICAL DATA: LEFT leg pain and swelling, had knee replacement
surgery 6 weeks ago



[Series 1: us extrem low venous*left* · 0.09mm/px · 13 of 33 slices shown]
[im 1/33]
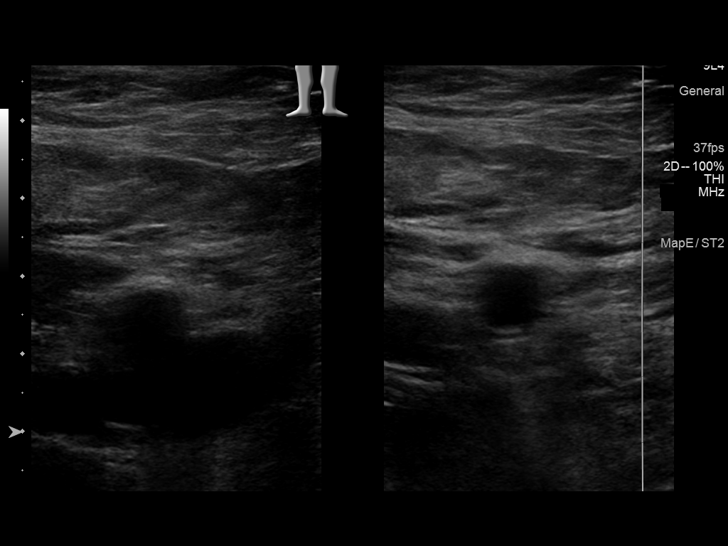
[im 3/33]
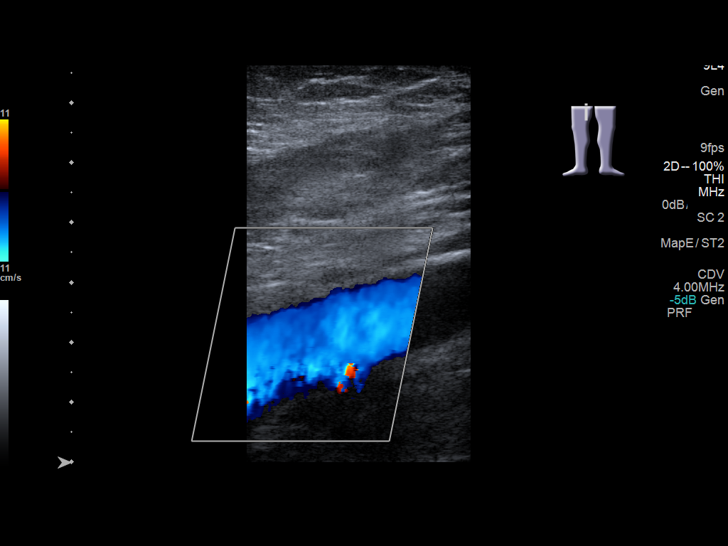
[im 6/33]
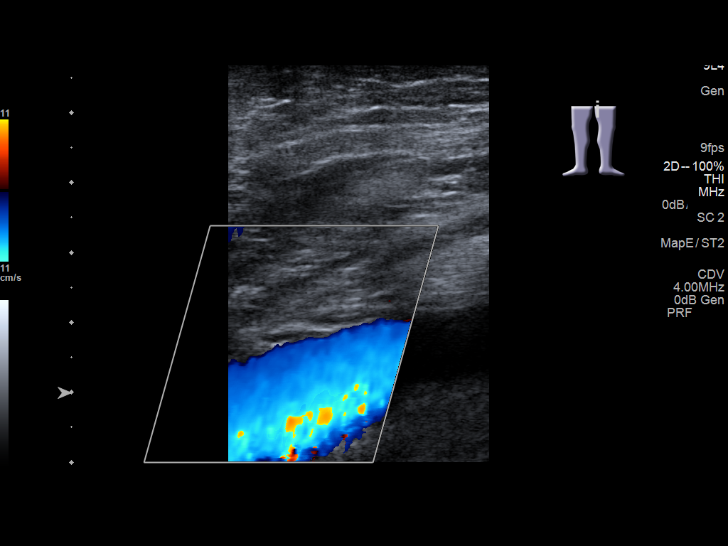
[im 9/33]
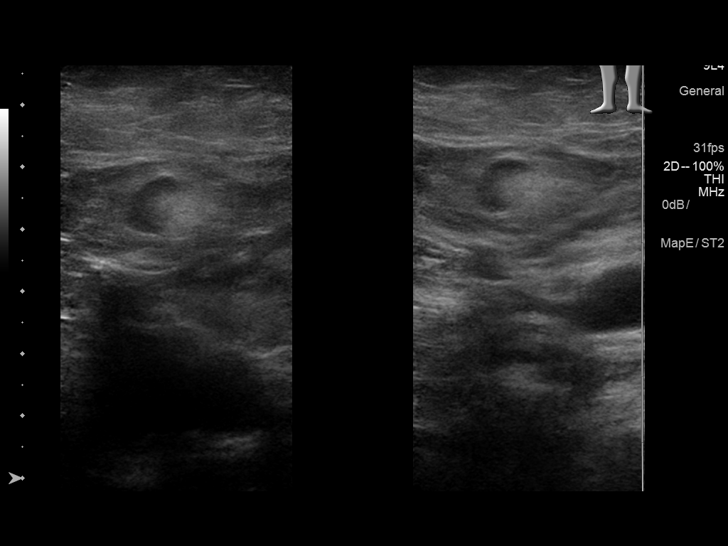
[im 12/33]
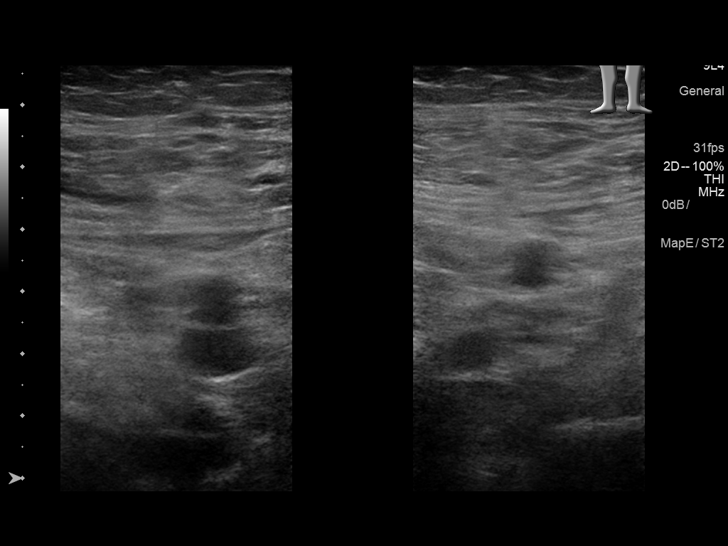
[im 14/33]
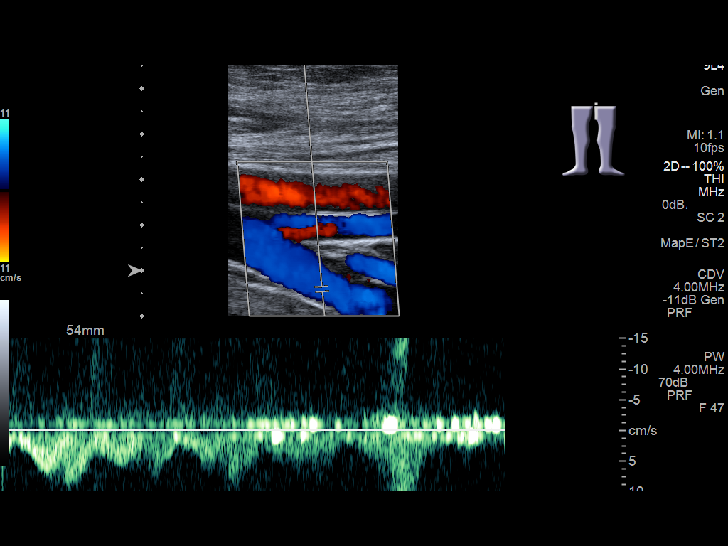
[im 17/33]
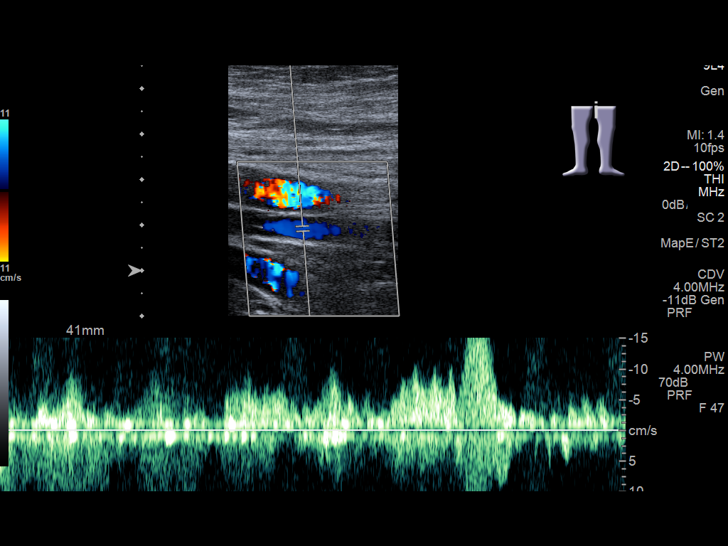
[im 19/33]
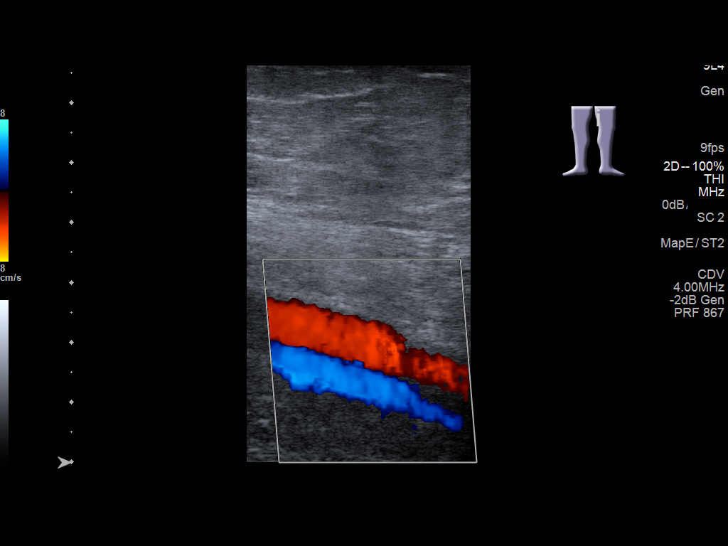
[im 21/33]
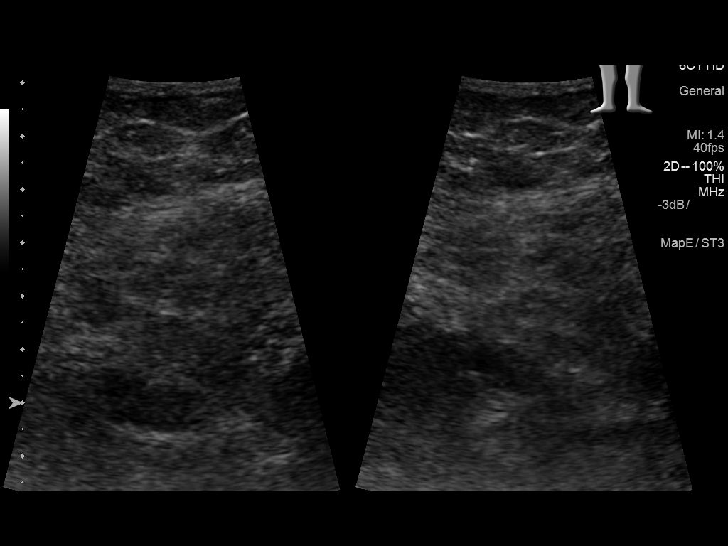
[im 24/33]
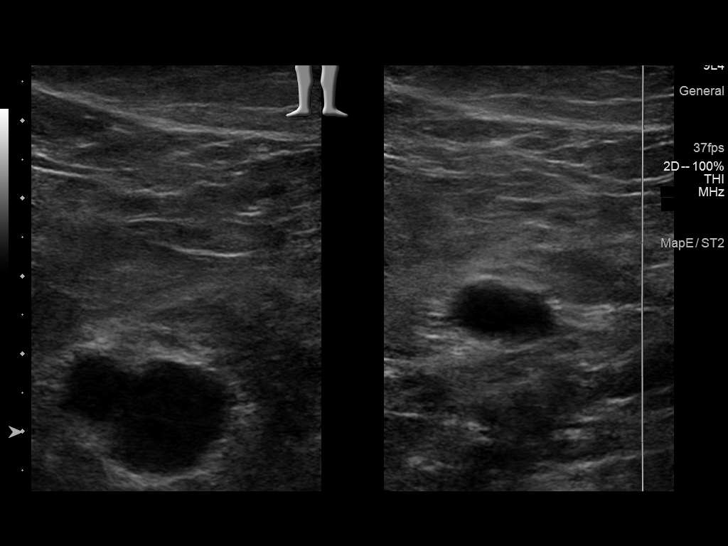
[im 27/33]
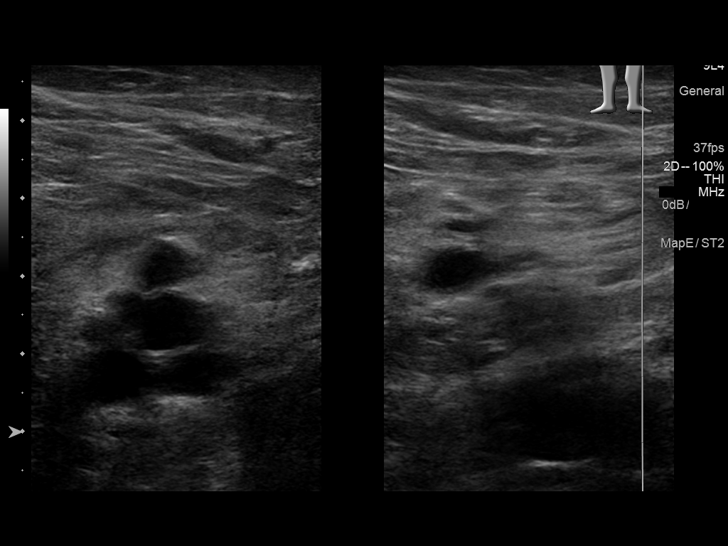
[im 30/33]
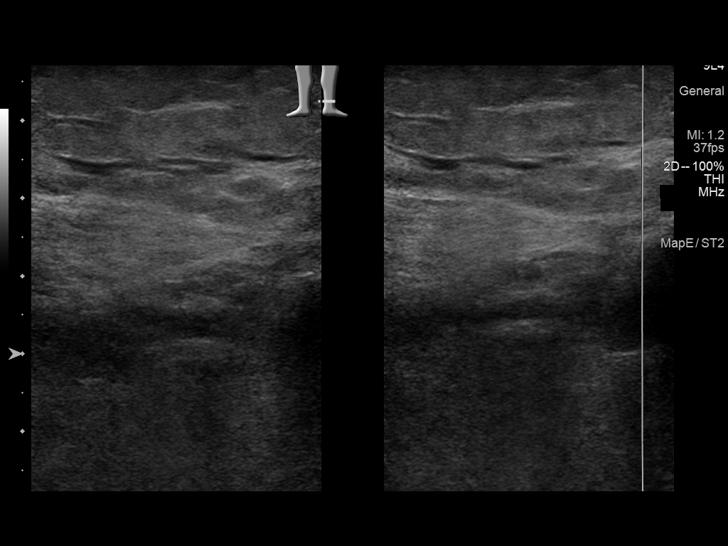
[im 33/33]
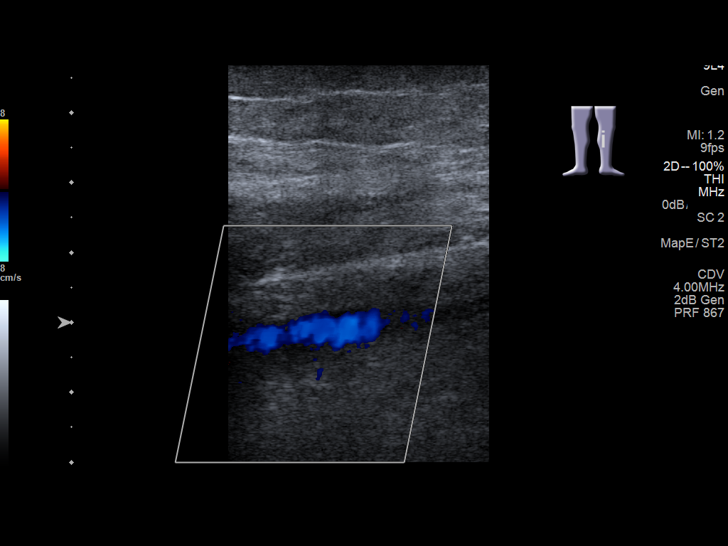

[13 of 24 positions shown; findings below may reference images not displayed]

FINDINGS: Contralateral Common Femoral Vein: Respiratory phasicity is normal
and symmetric with the symptomatic side. No evidence of thrombus.
Normal compressibility.

Common Femoral Vein: No evidence of thrombus. Normal
compressibility, respiratory phasicity and response to augmentation.

Saphenofemoral Junction: No evidence of thrombus. Normal
compressibility and flow on color Doppler imaging.

Profunda Femoral Vein: No evidence of thrombus. Normal
compressibility and flow on color Doppler imaging.

Femoral Vein: No evidence of thrombus. Normal compressibility,
respiratory phasicity and response to augmentation.

Popliteal Vein: No evidence of thrombus. Normal compressibility,
respiratory phasicity and response to augmentation.

Calf Veins: No evidence of thrombus. Normal compressibility and flow
on color Doppler imaging.

Superficial Great Saphenous Vein: No evidence of thrombus. Normal
compressibility and flow on color Doppler imaging.

Venous Reflux:  None.

Other Findings:  None.
IMPRESSION: No evidence of DVT within the LEFT lower extremity.

## 2017-11-17 DIAGNOSIS — Z96652 Presence of left artificial knee joint: Secondary | ICD-10-CM | POA: Diagnosis not present

## 2017-12-28 DIAGNOSIS — Z1212 Encounter for screening for malignant neoplasm of rectum: Secondary | ICD-10-CM | POA: Diagnosis not present

## 2017-12-28 DIAGNOSIS — Z1211 Encounter for screening for malignant neoplasm of colon: Secondary | ICD-10-CM | POA: Diagnosis not present

## 2018-01-01 DIAGNOSIS — G4733 Obstructive sleep apnea (adult) (pediatric): Secondary | ICD-10-CM | POA: Diagnosis not present

## 2018-04-16 DIAGNOSIS — R350 Frequency of micturition: Secondary | ICD-10-CM | POA: Diagnosis not present

## 2018-04-16 DIAGNOSIS — G894 Chronic pain syndrome: Secondary | ICD-10-CM | POA: Diagnosis not present

## 2018-04-16 DIAGNOSIS — I10 Essential (primary) hypertension: Secondary | ICD-10-CM | POA: Diagnosis not present

## 2019-04-28 DIAGNOSIS — L03116 Cellulitis of left lower limb: Secondary | ICD-10-CM | POA: Diagnosis not present

## 2019-05-03 DIAGNOSIS — G4733 Obstructive sleep apnea (adult) (pediatric): Secondary | ICD-10-CM | POA: Diagnosis not present

## 2019-07-27 DIAGNOSIS — Z1331 Encounter for screening for depression: Secondary | ICD-10-CM | POA: Diagnosis not present

## 2019-07-27 DIAGNOSIS — Z0000 Encounter for general adult medical examination without abnormal findings: Secondary | ICD-10-CM | POA: Diagnosis not present

## 2019-07-27 DIAGNOSIS — E669 Obesity, unspecified: Secondary | ICD-10-CM | POA: Diagnosis not present

## 2019-07-27 DIAGNOSIS — I10 Essential (primary) hypertension: Secondary | ICD-10-CM | POA: Diagnosis not present

## 2019-07-27 DIAGNOSIS — Z6841 Body Mass Index (BMI) 40.0 and over, adult: Secondary | ICD-10-CM | POA: Diagnosis not present

## 2019-07-27 DIAGNOSIS — Z23 Encounter for immunization: Secondary | ICD-10-CM | POA: Diagnosis not present

## 2019-07-27 DIAGNOSIS — G894 Chronic pain syndrome: Secondary | ICD-10-CM | POA: Diagnosis not present

## 2019-07-27 DIAGNOSIS — M545 Low back pain: Secondary | ICD-10-CM | POA: Diagnosis not present

## 2019-07-27 DIAGNOSIS — M2550 Pain in unspecified joint: Secondary | ICD-10-CM | POA: Diagnosis not present

## 2019-08-03 DIAGNOSIS — I10 Essential (primary) hypertension: Secondary | ICD-10-CM | POA: Diagnosis not present

## 2019-08-03 DIAGNOSIS — Z125 Encounter for screening for malignant neoplasm of prostate: Secondary | ICD-10-CM | POA: Diagnosis not present

## 2019-08-03 DIAGNOSIS — Z13220 Encounter for screening for lipoid disorders: Secondary | ICD-10-CM | POA: Diagnosis not present

## 2019-11-04 DIAGNOSIS — Z20828 Contact with and (suspected) exposure to other viral communicable diseases: Secondary | ICD-10-CM | POA: Diagnosis not present

## 2019-11-04 DIAGNOSIS — Z03818 Encounter for observation for suspected exposure to other biological agents ruled out: Secondary | ICD-10-CM | POA: Diagnosis not present

## 2020-01-27 DIAGNOSIS — G8929 Other chronic pain: Secondary | ICD-10-CM | POA: Diagnosis not present

## 2020-01-27 DIAGNOSIS — M549 Dorsalgia, unspecified: Secondary | ICD-10-CM | POA: Diagnosis not present

## 2020-01-27 DIAGNOSIS — M25569 Pain in unspecified knee: Secondary | ICD-10-CM | POA: Diagnosis not present

## 2020-01-27 DIAGNOSIS — I10 Essential (primary) hypertension: Secondary | ICD-10-CM | POA: Diagnosis not present

## 2020-01-27 DIAGNOSIS — R350 Frequency of micturition: Secondary | ICD-10-CM | POA: Diagnosis not present

## 2020-02-16 DIAGNOSIS — I10 Essential (primary) hypertension: Secondary | ICD-10-CM | POA: Diagnosis not present

## 2020-03-15 DIAGNOSIS — S8012XA Contusion of left lower leg, initial encounter: Secondary | ICD-10-CM | POA: Diagnosis not present

## 2020-03-23 DIAGNOSIS — M25441 Effusion, right hand: Secondary | ICD-10-CM | POA: Diagnosis not present

## 2020-03-23 DIAGNOSIS — M79641 Pain in right hand: Secondary | ICD-10-CM | POA: Diagnosis not present

## 2020-05-04 DIAGNOSIS — Z6841 Body Mass Index (BMI) 40.0 and over, adult: Secondary | ICD-10-CM | POA: Diagnosis not present

## 2020-05-04 DIAGNOSIS — I10 Essential (primary) hypertension: Secondary | ICD-10-CM | POA: Diagnosis not present

## 2020-05-18 DIAGNOSIS — Z6841 Body Mass Index (BMI) 40.0 and over, adult: Secondary | ICD-10-CM | POA: Diagnosis not present

## 2020-05-18 DIAGNOSIS — I10 Essential (primary) hypertension: Secondary | ICD-10-CM | POA: Diagnosis not present

## 2020-08-02 DIAGNOSIS — G8929 Other chronic pain: Secondary | ICD-10-CM | POA: Diagnosis not present

## 2020-08-02 DIAGNOSIS — I10 Essential (primary) hypertension: Secondary | ICD-10-CM | POA: Diagnosis not present

## 2020-08-02 DIAGNOSIS — E669 Obesity, unspecified: Secondary | ICD-10-CM | POA: Diagnosis not present

## 2020-08-02 DIAGNOSIS — J309 Allergic rhinitis, unspecified: Secondary | ICD-10-CM | POA: Diagnosis not present

## 2020-08-02 DIAGNOSIS — Z0000 Encounter for general adult medical examination without abnormal findings: Secondary | ICD-10-CM | POA: Diagnosis not present

## 2020-08-02 DIAGNOSIS — M545 Low back pain, unspecified: Secondary | ICD-10-CM | POA: Diagnosis not present

## 2020-08-02 DIAGNOSIS — Z23 Encounter for immunization: Secondary | ICD-10-CM | POA: Diagnosis not present

## 2020-08-02 DIAGNOSIS — Z6841 Body Mass Index (BMI) 40.0 and over, adult: Secondary | ICD-10-CM | POA: Diagnosis not present

## 2020-08-06 ENCOUNTER — Other Ambulatory Visit: Payer: Self-pay

## 2020-08-06 ENCOUNTER — Emergency Department
Admission: EM | Admit: 2020-08-06 | Discharge: 2020-08-06 | Disposition: A | Discharge status: Home / Self Care | Payer: Medicare HMO | Attending: Emergency Medicine | Admitting: Emergency Medicine

## 2020-08-06 ENCOUNTER — Emergency Department: Payer: Medicare HMO

## 2020-08-06 DIAGNOSIS — I10 Essential (primary) hypertension: Secondary | ICD-10-CM | POA: Insufficient documentation

## 2020-08-06 DIAGNOSIS — R202 Paresthesia of skin: Secondary | ICD-10-CM | POA: Diagnosis present

## 2020-08-06 DIAGNOSIS — G510 Bell's palsy: Secondary | ICD-10-CM | POA: Insufficient documentation

## 2020-08-06 DIAGNOSIS — R29810 Facial weakness: Secondary | ICD-10-CM

## 2020-08-06 DIAGNOSIS — Z79899 Other long term (current) drug therapy: Secondary | ICD-10-CM | POA: Insufficient documentation

## 2020-08-06 DIAGNOSIS — I6782 Cerebral ischemia: Secondary | ICD-10-CM | POA: Diagnosis not present

## 2020-08-06 LAB — CBC
HCT: 40.7 % (ref 39.0–52.0)
Hemoglobin: 14.2 g/dL (ref 13.0–17.0)
MCH: 30.5 pg (ref 26.0–34.0)
MCHC: 34.9 g/dL (ref 30.0–36.0)
MCV: 87.3 fL (ref 80.0–100.0)
Platelets: 197 10*3/uL (ref 150–400)
RBC: 4.66 MIL/uL (ref 4.22–5.81)
RDW: 12.1 % (ref 11.5–15.5)
WBC: 6.3 10*3/uL (ref 4.0–10.5)
nRBC: 0 % (ref 0.0–0.2)

## 2020-08-06 LAB — PROTIME-INR
INR: 1 (ref 0.8–1.2)
Prothrombin Time: 12.9 seconds (ref 11.4–15.2)

## 2020-08-06 LAB — COMPREHENSIVE METABOLIC PANEL
ALT: 19 U/L (ref 0–44)
AST: 21 U/L (ref 15–41)
Albumin: 4 g/dL (ref 3.5–5.0)
Alkaline Phosphatase: 68 U/L (ref 38–126)
Anion gap: 10 (ref 5–15)
BUN: 14 mg/dL (ref 8–23)
CO2: 28 mmol/L (ref 22–32)
Calcium: 8.8 mg/dL — ABNORMAL LOW (ref 8.9–10.3)
Chloride: 99 mmol/L (ref 98–111)
Creatinine, Ser: 1.09 mg/dL (ref 0.61–1.24)
GFR, Estimated: >60 mL/min (ref >60)
Glucose, Bld: 124 mg/dL — ABNORMAL HIGH (ref 70–99)
Potassium: 4 mmol/L (ref 3.5–5.1)
Sodium: 137 mmol/L (ref 135–145)
Total Bilirubin: 0.9 mg/dL (ref 0.3–1.2)
Total Protein: 7.5 g/dL (ref 6.5–8.1)

## 2020-08-06 LAB — DIFFERENTIAL
Abs Immature Granulocytes: 0.03 10*3/uL (ref 0.00–0.07)
Basophils Absolute: 0 10*3/uL (ref 0.0–0.1)
Basophils Relative: 1 %
Eosinophils Absolute: 0.1 10*3/uL (ref 0.0–0.5)
Eosinophils Relative: 1 %
Immature Granulocytes: 1 %
Lymphocytes Relative: 19 %
Lymphs Abs: 1.2 10*3/uL (ref 0.7–4.0)
Monocytes Absolute: 0.5 10*3/uL (ref 0.1–1.0)
Monocytes Relative: 8 %
Neutro Abs: 4.5 10*3/uL (ref 1.7–7.7)
Neutrophils Relative %: 70 %

## 2020-08-06 LAB — CBG MONITORING, ED: Glucose-Capillary: 129 mg/dL — ABNORMAL HIGH (ref 70–99)

## 2020-08-06 LAB — APTT: aPTT: 30 seconds (ref 24–36)

## 2020-08-06 MED ORDER — VALACYCLOVIR HCL 1 G PO TABS: 1000.0000 mg | ORAL_TABLET | Freq: Three times a day (TID) | ORAL | 0 refills | Status: AC

## 2020-08-06 MED ORDER — PREDNISONE 10 MG PO TABS: ORAL_TABLET | ORAL | 0 refills | Status: AC

## 2020-08-06 MED ORDER — SODIUM CHLORIDE 0.9% FLUSH: 3.0000 mL | Freq: Once | INTRAVENOUS | Status: DC

## 2020-08-06 NOTE — ED Notes (Signed)
Pt up to toilet. Wife at bedside

## 2020-08-06 NOTE — ED Notes (Signed)
Pt and wife verbalize understanding of d/c instructions at this time. Pt and wife deny further questions at this time.   Pt instructed how to use eye shield at home and denies further questions about that at this time

## 2020-08-06 NOTE — Discharge Instructions (Signed)
For your eye: - Purchase over-the-counter eye drops to keep your eye moist, and use night-time drops at night - I recommend Rephresh night-time but any over-the-counter medications will help - Keep your eye protected at night with an eye pad  Your facial paralysis should improve over the next week  Take the meds as prescribed

## 2020-08-06 NOTE — ED Triage Notes (Signed)
Pt reports rt sided facial numbness and facial droop since 0900 this am. Pt denies any other weakness.

## 2020-08-06 NOTE — Progress Notes (Signed)
CODE STROKE- PHARMACY COMMUNICATION   Time CODE STROKE called/page received:[id number]  Time response to CODE STROKE was made (in person or via phone): @ 1250  Time Stroke Kit retrieved from [place] (only if needed):@ 1250  Name of Provider/Nurse contacted: Minette Brine, RN  Past Medical History:  Diagnosis Date  . Arthritis   . Hypertension   . Sleep apnea    CPAP   Prior to Admission medications   Medication Sig Start Date End Date Taking? Authorizing Provider  carisoprodol (SOMA) 350 MG tablet Take 175 mg by mouth 2 (two) times daily. 1400 & 1800 08/20/16   [provider]  docusate sodium (COLACE) 100 MG capsule Take 100 mg by mouth daily. 09/05/16   [provider]  HYDROcodone-acetaminophen (NORCO) 10-325 MG tablet Take 0.5-1 tablets by mouth 3 (three) times daily. 1 tablet in the morning, 0.5 tablet at 3 pm, 0.5 tablet at 6pm 08/20/16   [provider]  ibuprofen (ADVIL,MOTRIN) 200 MG tablet Take 200 mg by mouth 2 (two) times daily.    [provider]  lisinopril-hydrochlorothiazide (PRINZIDE,ZESTORETIC) 20-25 MG tablet Take 1 tablet by mouth daily. 08/16/16   [provider]  methadone (DOLOPHINE) 10 MG tablet Take 10 mg by mouth 2 (two) times daily. 10 mg in the morning & 10 mg at 1800 08/20/16   [provider]  Oxycodone HCl 10 MG TABS Take 1 tablet (10 mg total) by mouth 4 (four) times daily as needed (severe pain). 10/15/16   Leanor Kail, MD  predniSONE (DELTASONE) 10 MG tablet Take 6 tablets (60 mg total) by mouth daily for 5 days, THEN 5 tablets (50 mg total) daily for 1 day, THEN 4 tablets (40 mg total) daily for 1 day, THEN 3 tablets (30 mg total) daily for 1 day, THEN 2 tablets (20 mg total) daily for 1 day, THEN 1 tablet (10 mg total) daily for 1 day. 08/06/20 08/16/20  Duffy Bruce, MD  traZODone (DESYREL) 100 MG tablet Take 100 mg by mouth at bedtime. 08/20/16   [provider]  valACYclovir (VALTREX) 1000 MG  tablet Take 1 tablet (1,000 mg total) by mouth 3 (three) times daily for 7 days. 08/06/20 08/13/20  Duffy Bruce, MD    Pernell Dupre, PharmD, BCPS Clinical Pharmacist 08/06/2020 12:10 PM

## 2020-08-06 NOTE — ED Notes (Signed)
MD Amada Jupiter cancelled CODE STROKE at this time.

## 2020-08-06 NOTE — Consult Note (Signed)
Neurology Consultation Reason for Consult: Facial weakness Referring Physician: Erma Heritage, C  CC: Facial weakness  History is obtained from: Patient  HPI: Thomas Pruitt is a 64 y.o. male who noticed that the right side of his face did not feel quite right this morning.  He initially described some "tingling", but when I pressed him on it, he states that really he just felt like it was not moving quite normally.  He denies any pins-and-needles, or real change in sensation.  He also noticed yesterday that his right eye was watering, and woke up with his right eye feeling dry/crusty.  When he noticed that his right face was not moving as well, he sought care in the emergency department today where a code stroke was activated.  He denies any taste or hearing changes.  LKW: Yesterday tpa given?: no, not a stroke    ROS: A 14 point ROS was performed and is negative except as noted in the HPI.    Past Medical History:  Diagnosis Date  . Arthritis   . Hypertension   . Sleep apnea    CPAP     Family history: No history of similar   Social History:  reports that he has never smoked. He has never used smokeless tobacco. He reports that he does not drink alcohol and does not use drugs.   Exam: Current vital signs: BP (!) 162/91   Pulse 67   Resp 18   Ht 6\' 4"  (1.93 m)   Wt (!) 174.6 kg   SpO2 94%   BMI 46.86 kg/m  Vital signs in last 24 hours: Pulse Rate:  [67-79] 67 (11/08 1145) Resp:  [18] 18 (11/08 1129) BP: (153-162)/(91-94) 162/91 (11/08 1145) SpO2:  [94 %-100 %] 94 % (11/08 1145) Weight:  [174.6 kg] 174.6 kg (11/08 1148)   Physical Exam  Constitutional: Appears well-developed and well-nourished.  Psych: Affect appropriate to situation Eyes: No scleral injection HENT: No OP obstrucion MSK: no joint deformities.  Cardiovascular: Normal rate and regular rhythm.  Respiratory: Effort normal, non-labored breathing GI: Soft.  No distension. There is no tenderness.   Skin: WDI  Neuro: Mental Status: Patient is awake, alert, oriented to person, place, month, year, and situation. Patient is able to give a clear and coherent history. No signs of aphasia or neglect Cranial Nerves: II: Visual Fields are full. Pupils are equal, round, and reactive to light.   III,IV, VI: EOMI without ptosis or diploplia.  V: Facial sensation is symmetric to temperature VII: Facial movement is weak on the right including the upper and lower face VIII: hearing is intact to voice X: Uvula elevates symmetrically XI: Shoulder shrug is symmetric. XII: tongue is midline without atrophy or fasciculations.  Motor: Tone is normal. Bulk is normal. 5/5 strength was present in all four extremities.  Sensory: Sensation is symmetric to light touch and temperature in the arms and legs. Cerebellar: FNF and HKS are intact bilaterally   I have reviewed labs in epic and the results pertinent to this consultation are: Creatinine 1.09  I have reviewed the images obtained: CT head-negative  Impression: 64 year old male with an isolated 7th nerve palsy consistent with Bell's palsy.  Given the characteristic appearance progressive nature, I do not think any further work-up is needed at this time.  He does have significant symptoms and therefore I would recommend him get Valtrex in addition to prednisone  Recommendations: 1) prednisone 60 mg x 5 days followed by decreasing by 10 mg/day/day for  a total 10-day course 2) Valtrex 1 g 3 times daily x7 days 3) no further work-up at this time.   Ritta Slot, MD [facility] [phone]  If 7pm- 7am, please page neurology on call as listed in [facility]ION.

## 2020-08-06 NOTE — ED Notes (Signed)
Neurologist at bedside assessing pt

## 2020-08-06 NOTE — ED Notes (Signed)
Code stroke called to [phone]  [phone]

## 2020-08-06 NOTE — Progress Notes (Signed)
[facility] responded to CODE STROKE in ED; when [facility] arrived pt. being examined by neurologist; at length, pt. shared his wife came to hospital w/him;  [facility] assisted in finding wife and bringing her to rm.  No immediate needs at this time.  Ch remains available.

## 2020-08-06 NOTE — ED Provider Notes (Signed)
[facility] Emergency Department Provider Note  ____________________________________________   First MD Initiated Contact with Patient 08/06/20 1158     (approximate)  I have reviewed the triage vital signs and the nursing notes.   HISTORY  Chief Complaint Numbness    HPI Thomas Pruitt is a 64 y.o. male  Here with facial numbness.   Patient states that he may have noticed some itchiness in his right eye upon going to bed last night.  When he woke up this morning, and began eating breakfast and brushing his teeth, he noticed that water was falling on the right side of his mouth.  He notes he had difficulty chewing.  He also states he had a "tingling sensation in his right face yesterday, that is now resolved.  He subsequently presents for evaluation.  Denies any vision changes.  No numbness or weakness.  No history of recent illness, though he did receive a flu vaccine last week.  No recent fevers or chills.  No history of stroke.  Denies any change in taste.  No change in smell.  He was activated as a code stroke on arrival.       Past Medical History:  Diagnosis Date  . Arthritis   . Hypertension   . Sleep apnea    CPAP    There are no problems to display for this patient.   Past Surgical History:  Procedure Laterality Date  . HARDWARE REMOVAL Left 10/15/2016   Procedure: HARDWARE REMOVAL;  Surgeon: Erin Sons, MD;  Location: [facility] ORS;  Service: Orthopedics;  Laterality: Left;  . JOINT REPLACEMENT Right    PARTIAL  . KNEE ARTHROSCOPY Left    X2    Prior to Admission medications   Medication Sig Start Date End Date Taking? Authorizing Provider  carisoprodol (SOMA) 350 MG tablet Take 175 mg by mouth 2 (two) times daily. 1400 & 1800 08/20/16   [provider]  docusate sodium (COLACE) 100 MG capsule Take 100 mg by mouth daily. 09/05/16   [provider]  HYDROcodone-acetaminophen (NORCO) 10-325 MG tablet Take 0.5-1  tablets by mouth 3 (three) times daily. 1 tablet in the morning, 0.5 tablet at 3 pm, 0.5 tablet at 6pm 08/20/16   [provider]  ibuprofen (ADVIL,MOTRIN) 200 MG tablet Take 200 mg by mouth 2 (two) times daily.    [provider]  lisinopril-hydrochlorothiazide (PRINZIDE,ZESTORETIC) 20-25 MG tablet Take 1 tablet by mouth daily. 08/16/16   [provider]  methadone (DOLOPHINE) 10 MG tablet Take 10 mg by mouth 2 (two) times daily. 10 mg in the morning & 10 mg at 1800 08/20/16   [provider]  Oxycodone HCl 10 MG TABS Take 1 tablet (10 mg total) by mouth 4 (four) times daily as needed (severe pain). 10/15/16   Erin Sons, MD  predniSONE (DELTASONE) 10 MG tablet Take 6 tablets (60 mg total) by mouth daily for 5 days, THEN 5 tablets (50 mg total) daily for 1 day, THEN 4 tablets (40 mg total) daily for 1 day, THEN 3 tablets (30 mg total) daily for 1 day, THEN 2 tablets (20 mg total) daily for 1 day, THEN 1 tablet (10 mg total) daily for 1 day. 08/06/20 08/16/20  Shaune Pollack, MD  traZODone (DESYREL) 100 MG tablet Take 100 mg by mouth at bedtime. 08/20/16   [provider]  valACYclovir (VALTREX) 1000 MG tablet Take 1 tablet (1,000 mg total) by mouth 3 (three) times daily for 7  days. 08/06/20 08/13/20  Shaune PollackIsaacs, Shamela Haydon, MD    Allergies Celebrex [celecoxib]  No family history on file.  Social History Social History   Tobacco Use  . Smoking status: Never Smoker  . Smokeless tobacco: Never Used  Substance Use Topics  . Alcohol use: No  . Drug use: No    Review of Systems  Review of Systems  Constitutional: Negative for chills, fatigue and fever.  HENT: Negative for sore throat.   Respiratory: Negative for shortness of breath.   Cardiovascular: Negative for chest pain.  Gastrointestinal: Negative for abdominal pain.  Genitourinary: Negative for flank pain.  Musculoskeletal: Negative for neck pain.  Skin: Negative for rash and wound.   Allergic/Immunologic: Negative for immunocompromised state.  Neurological: Positive for facial asymmetry. Negative for weakness and numbness.  Hematological: Does not bruise/bleed easily.  All other systems reviewed and are negative.    ____________________________________________  PHYSICAL EXAM:      VITAL SIGNS: ED Triage Vitals  Enc Vitals Group     BP 08/06/20 1129 (!) 153/94     Pulse Rate 08/06/20 1129 79     Resp 08/06/20 1129 18     Temp --      Temp src --      SpO2 08/06/20 1129 100 %     Weight 08/06/20 1148 (!) 385 lb (174.6 kg)     Height 08/06/20 1148 6\' 4"  (1.93 m)     Head Circumference --      Peak Flow --      Pain Score --      Pain Loc --      Pain Edu? --      Excl. in GC? --      Physical Exam Vitals and nursing note reviewed.  Constitutional:      General: He is not in acute distress.    Appearance: He is well-developed.  HENT:     Head: Normocephalic and atraumatic.  Eyes:     Conjunctiva/sclera: Conjunctivae normal.  Cardiovascular:     Rate and Rhythm: Normal rate and regular rhythm.     Heart sounds: Normal heart sounds. No murmur heard.  No friction rub.  Pulmonary:     Effort: Pulmonary effort is normal. No respiratory distress.     Breath sounds: Normal breath sounds. No wheezing or rales.  Abdominal:     General: There is no distension.     Palpations: Abdomen is soft.     Tenderness: There is no abdominal tenderness.  Musculoskeletal:     Cervical back: Neck supple.  Skin:    General: Skin is warm.     Capillary Refill: Capillary refill takes less than 2 seconds.  Neurological:     Mental Status: He is alert and oriented to person, place, and time.     Motor: No abnormal muscle tone.     Comments: Dense right facial droop involving the forehead.  Normal sensation along bilateral trigeminal nerve distributions.  Cranial nerves otherwise intact.  Strength out of 5 bilateral upper and lower extremities.  Normal sensation light  touch.  No cerebellar abnormalities.  Normal gait.       ____________________________________________   LABS (all labs ordered are listed, but only abnormal results are displayed)  Labs Reviewed  COMPREHENSIVE METABOLIC PANEL - Abnormal; Notable for the following components:      Result Value   Glucose, Bld 124 (*)    Calcium 8.8 (*)    All other components within normal limits  CBG MONITORING, ED - Abnormal; Notable for the following components:   Glucose-Capillary 129 (*)    All other components within normal limits  PROTIME-INR  APTT  CBC  DIFFERENTIAL    ____________________________________________  EKG: Normal sinus rhythm, VR 68. QRS 111, QTc 449. PAC, low voltage. No ST elevations or depressions. ________________________________________  RADIOLOGY All imaging, including plain films, CT scans, and ultrasounds, independently reviewed by me, and interpretations confirmed via formal radiology reads.  ED MD interpretation:   CT Head; NAICA  Official radiology report(s): CT HEAD CODE STROKE WO CONTRAST  Result Date: 08/06/2020 CLINICAL DATA:  Code stroke. Neuro deficit, acute, stroke suspected. Additional history obtained from electronic MEDICAL RECORD NUMBERPatient reports right-sided facial numbness and facial droop since 9 a.m. this morning. EXAM: CT HEAD WITHOUT CONTRAST TECHNIQUE: Contiguous axial images were obtained from the base of the skull through the vertex without intravenous contrast. COMPARISON:  No pertinent prior exams are available for comparison. FINDINGS: Brain: Cerebral volume is normal for age. Mild patchy hypodensity within the cerebral white matter is nonspecific, but compatible with chronic small vessel ischemic disease. There is no acute intracranial hemorrhage. No demarcated cortical infarct. No extra-axial fluid collection. No evidence of intracranial mass. No midline shift. Vascular: No hyperdense vessel.  Atherosclerotic calcifications. Skull: Normal.  Negative for fracture or focal lesion. Sinuses/Orbits: Visualized orbits show no acute finding. No significant paranasal sinus disease at the imaged levels. ASPECTS (Alberta Stroke Program Early CT Score) - Ganglionic level infarction (caudate, lentiform nuclei, internal capsule, insula, M1-M3 cortex): 7 - Supraganglionic infarction (M4-M6 cortex): 3 Total score (0-10 with 10 being normal): 10 These results were called by telephone at the time of interpretation on 08/06/2020 at 11:46 am to provider Shaune Pollack , who verbally acknowledged these results. IMPRESSION: No evidence of acute intracranial abnormality.  ASPECTS is 10. Mild chronic small vessel ischemic disease. Electronically Signed   By: Jackey Loge DO   On: 08/06/2020 11:48    ____________________________________________  PROCEDURES   Procedure(s) performed (including Critical Care):  Procedures  ____________________________________________  INITIAL IMPRESSION / MDM / ASSESSMENT AND PLAN / ED COURSE  As part of my medical decision making, I reviewed the following data within the electronic MEDICAL RECORD NUMBER Nursing notes reviewed and incorporated, Old chart reviewed, Notes from prior ED visits, and [place] Controlled Substance Database       *DUNG SALINGER was evaluated in Emergency Department on 08/06/2020 for the symptoms described in the history of present illness. He was evaluated in the context of the global COVID-19 pandemic, which necessitated consideration that the patient might be at risk for infection with the SARS-CoV-2 virus that causes COVID-19. Institutional protocols and algorithms that pertain to the evaluation of patients at risk for COVID-19 are in a state of rapid change based on information released by regulatory bodies including the CDC and federal and state organizations. These policies and algorithms were followed during the patient's care in the ED.  Some ED evaluations and interventions may be delayed as a  result of limited staffing during the pandemic.*     Medical Decision Making: 64 year old male here with right-sided facial droop.  Exam is consistent with peripheral cranial nerve VII palsy, or Bell's palsy.  I suspect this was exacerbated due to recent flu shot.  He has no other neurological deficits on full neurological exam.  CT head negative.  His screening lab work is largely unremarkable.  EKG shows normal sinus rhythm and he has no arrhythmia on telemetry.  He was initially activated as a code stroke, and Dr. Amada Jupiter has evaluated.  I discussed with Dr. Amada Jupiter.  Given clinical presentation highly consistent with Bell's, does not need further imaging.  Will treat with Valtrex, prednisone, and eye protection.  Return precautions were given.  ____________________________________________  FINAL CLINICAL IMPRESSION(S) / ED DIAGNOSES  Final diagnoses:  Bell's palsy     MEDICATIONS GIVEN DURING THIS VISIT:  Medications  sodium chloride flush (NS) 0.9 % injection 3 mL ( Intravenous Canceled Entry 08/06/20 1155)     ED Discharge Orders         Ordered    predniSONE (DELTASONE) 10 MG tablet        08/06/20 1200    valACYclovir (VALTREX) 1000 MG tablet  3 times daily        08/06/20 1200           Note:  This document was prepared using Dragon voice recognition software and may include unintentional dictation errors.   Shaune Pollack, MD 08/06/20 1325

## 2020-08-06 NOTE — ED Notes (Signed)
Code stroke called to ED secretary.

## 2020-08-13 DIAGNOSIS — Z0100 Encounter for examination of eyes and vision without abnormal findings: Secondary | ICD-10-CM | POA: Diagnosis not present

## 2020-08-13 DIAGNOSIS — H524 Presbyopia: Secondary | ICD-10-CM | POA: Diagnosis not present

## 2021-03-22 DIAGNOSIS — G8929 Other chronic pain: Secondary | ICD-10-CM | POA: Diagnosis not present

## 2021-03-22 DIAGNOSIS — I10 Essential (primary) hypertension: Secondary | ICD-10-CM | POA: Diagnosis not present

## 2021-03-22 DIAGNOSIS — M545 Low back pain, unspecified: Secondary | ICD-10-CM | POA: Diagnosis not present

## 2021-03-22 DIAGNOSIS — Z6841 Body Mass Index (BMI) 40.0 and over, adult: Secondary | ICD-10-CM | POA: Diagnosis not present

## 2021-08-06 DIAGNOSIS — G4730 Sleep apnea, unspecified: Secondary | ICD-10-CM | POA: Diagnosis not present

## 2021-08-06 DIAGNOSIS — E669 Obesity, unspecified: Secondary | ICD-10-CM | POA: Diagnosis not present

## 2021-08-06 DIAGNOSIS — Z1211 Encounter for screening for malignant neoplasm of colon: Secondary | ICD-10-CM | POA: Diagnosis not present

## 2021-08-06 DIAGNOSIS — I10 Essential (primary) hypertension: Secondary | ICD-10-CM | POA: Diagnosis not present

## 2021-08-06 DIAGNOSIS — J309 Allergic rhinitis, unspecified: Secondary | ICD-10-CM | POA: Diagnosis not present

## 2021-08-06 DIAGNOSIS — M549 Dorsalgia, unspecified: Secondary | ICD-10-CM | POA: Diagnosis not present

## 2021-08-06 DIAGNOSIS — Z125 Encounter for screening for malignant neoplasm of prostate: Secondary | ICD-10-CM | POA: Diagnosis not present

## 2021-08-06 DIAGNOSIS — Z1389 Encounter for screening for other disorder: Secondary | ICD-10-CM | POA: Diagnosis not present

## 2021-08-06 DIAGNOSIS — Z23 Encounter for immunization: Secondary | ICD-10-CM | POA: Diagnosis not present

## 2021-08-06 DIAGNOSIS — Z0000 Encounter for general adult medical examination without abnormal findings: Secondary | ICD-10-CM | POA: Diagnosis not present

## 2021-08-16 DIAGNOSIS — Z1211 Encounter for screening for malignant neoplasm of colon: Secondary | ICD-10-CM | POA: Diagnosis not present

## 2021-08-21 LAB — EXTERNAL GENERIC LAB PROCEDURE: COLOGUARD: NEGATIVE

## 2021-08-21 LAB — COLOGUARD: COLOGUARD: NEGATIVE

## 2021-09-05 DIAGNOSIS — Z13220 Encounter for screening for lipoid disorders: Secondary | ICD-10-CM | POA: Diagnosis not present

## 2021-09-05 DIAGNOSIS — I10 Essential (primary) hypertension: Secondary | ICD-10-CM | POA: Diagnosis not present

## 2021-09-05 DIAGNOSIS — Z125 Encounter for screening for malignant neoplasm of prostate: Secondary | ICD-10-CM | POA: Diagnosis not present

## 2021-10-02 DIAGNOSIS — H524 Presbyopia: Secondary | ICD-10-CM | POA: Diagnosis not present

## 2021-10-02 DIAGNOSIS — Z0100 Encounter for examination of eyes and vision without abnormal findings: Secondary | ICD-10-CM | POA: Diagnosis not present

## 2021-10-23 DIAGNOSIS — N390 Urinary tract infection, site not specified: Secondary | ICD-10-CM | POA: Diagnosis not present

## 2021-10-23 DIAGNOSIS — R1084 Generalized abdominal pain: Secondary | ICD-10-CM | POA: Diagnosis not present

## 2021-10-23 DIAGNOSIS — R300 Dysuria: Secondary | ICD-10-CM | POA: Diagnosis not present

## 2021-11-12 DIAGNOSIS — G4730 Sleep apnea, unspecified: Secondary | ICD-10-CM | POA: Diagnosis not present

## 2021-11-12 DIAGNOSIS — Z87440 Personal history of urinary (tract) infections: Secondary | ICD-10-CM | POA: Diagnosis not present

## 2021-12-19 DIAGNOSIS — G4733 Obstructive sleep apnea (adult) (pediatric): Secondary | ICD-10-CM | POA: Diagnosis not present

## 2022-02-17 DIAGNOSIS — I10 Essential (primary) hypertension: Secondary | ICD-10-CM | POA: Diagnosis not present

## 2022-02-17 DIAGNOSIS — G4730 Sleep apnea, unspecified: Secondary | ICD-10-CM | POA: Diagnosis not present

## 2022-08-27 DIAGNOSIS — Z0000 Encounter for general adult medical examination without abnormal findings: Secondary | ICD-10-CM | POA: Diagnosis not present

## 2022-08-27 DIAGNOSIS — J309 Allergic rhinitis, unspecified: Secondary | ICD-10-CM | POA: Diagnosis not present

## 2022-08-27 DIAGNOSIS — G8929 Other chronic pain: Secondary | ICD-10-CM | POA: Diagnosis not present

## 2022-08-27 DIAGNOSIS — R3915 Urgency of urination: Secondary | ICD-10-CM | POA: Diagnosis not present

## 2022-08-27 DIAGNOSIS — M549 Dorsalgia, unspecified: Secondary | ICD-10-CM | POA: Diagnosis not present

## 2022-08-27 DIAGNOSIS — I10 Essential (primary) hypertension: Secondary | ICD-10-CM | POA: Diagnosis not present

## 2022-08-27 DIAGNOSIS — Z6841 Body Mass Index (BMI) 40.0 and over, adult: Secondary | ICD-10-CM | POA: Diagnosis not present

## 2022-08-27 DIAGNOSIS — E669 Obesity, unspecified: Secondary | ICD-10-CM | POA: Diagnosis not present

## 2022-12-11 DIAGNOSIS — R051 Acute cough: Secondary | ICD-10-CM | POA: Diagnosis not present

## 2022-12-11 DIAGNOSIS — Z6841 Body Mass Index (BMI) 40.0 and over, adult: Secondary | ICD-10-CM | POA: Diagnosis not present

## 2023-01-07 DIAGNOSIS — H524 Presbyopia: Secondary | ICD-10-CM | POA: Diagnosis not present

## 2023-01-07 DIAGNOSIS — H5203 Hypermetropia, bilateral: Secondary | ICD-10-CM | POA: Diagnosis not present

## 2023-02-25 DIAGNOSIS — I10 Essential (primary) hypertension: Secondary | ICD-10-CM | POA: Diagnosis not present

## 2023-02-25 DIAGNOSIS — Z0000 Encounter for general adult medical examination without abnormal findings: Secondary | ICD-10-CM | POA: Diagnosis not present

## 2023-02-25 DIAGNOSIS — R972 Elevated prostate specific antigen [PSA]: Secondary | ICD-10-CM | POA: Diagnosis not present

## 2023-02-25 DIAGNOSIS — G8929 Other chronic pain: Secondary | ICD-10-CM | POA: Diagnosis not present

## 2023-02-25 DIAGNOSIS — M545 Low back pain, unspecified: Secondary | ICD-10-CM | POA: Diagnosis not present

## 2023-06-05 DIAGNOSIS — M7989 Other specified soft tissue disorders: Secondary | ICD-10-CM | POA: Diagnosis not present

## 2023-06-05 DIAGNOSIS — R239 Unspecified skin changes: Secondary | ICD-10-CM | POA: Diagnosis not present

## 2023-07-27 DIAGNOSIS — M25441 Effusion, right hand: Secondary | ICD-10-CM | POA: Diagnosis not present

## 2023-08-17 DIAGNOSIS — L03116 Cellulitis of left lower limb: Secondary | ICD-10-CM | POA: Diagnosis not present

## 2023-11-04 DIAGNOSIS — Z0000 Encounter for general adult medical examination without abnormal findings: Secondary | ICD-10-CM | POA: Diagnosis not present

## 2023-11-04 DIAGNOSIS — M545 Low back pain, unspecified: Secondary | ICD-10-CM | POA: Diagnosis not present

## 2023-11-04 DIAGNOSIS — I10 Essential (primary) hypertension: Secondary | ICD-10-CM | POA: Diagnosis not present

## 2023-11-04 DIAGNOSIS — Z23 Encounter for immunization: Secondary | ICD-10-CM | POA: Diagnosis not present

## 2023-11-04 DIAGNOSIS — E669 Obesity, unspecified: Secondary | ICD-10-CM | POA: Diagnosis not present

## 2023-12-25 DIAGNOSIS — G4733 Obstructive sleep apnea (adult) (pediatric): Secondary | ICD-10-CM | POA: Diagnosis not present

## 2024-01-25 DIAGNOSIS — G4733 Obstructive sleep apnea (adult) (pediatric): Secondary | ICD-10-CM | POA: Diagnosis not present

## 2024-01-28 ENCOUNTER — Ambulatory Visit (HOSPITAL_COMMUNITY)
Admission: RE | Admit: 2024-01-28 | Discharge: 2024-01-28 | Disposition: A | Discharge status: Home / Self Care | Source: Ambulatory Visit | Attending: Internal Medicine | Admitting: Internal Medicine

## 2024-01-28 VITALS — BP 120/82 | HR 65 | Ht 76.0 in | Wt 387.2 lb

## 2024-01-28 DIAGNOSIS — I4891 Unspecified atrial fibrillation: Secondary | ICD-10-CM | POA: Diagnosis not present

## 2024-01-28 NOTE — Patient Instructions (Addendum)
 Echocardiogram scheduling will call after authorization from insurance completed.

## 2024-01-28 NOTE — Progress Notes (Signed)
 Primary Care Physician: Rory Collard, MD Primary Cardiologist: None Electrophysiologist: None     Referring Physician: PCP     Thomas Pruitt is a 68 y.o. male with a history of HTN, HLD, chronic back pain on methadone and soma, OSA on CPAP, and atrial fibrillation who presents for consultation in the [facility]. Outside PCP notes show history of HTN on multiple medications. Apple watch today showed new onset Afib. Patient has a CHADS2VASC score of 2.  On evaluation today, he is currently in NSR. Patient has an Apple watch SE which alerted him earlier this morning that he was in Afib for about 5-10 minutes. He put on wife's Apple watch (series 7) and it notified him he was in Afib. Review of rhythm strip from wife's Apple watch does appear consistent with Afib. He has not had this before. He does not drink alcohol. He is compliant with CPAP. He drinks 1-2 cups of coffee daily. He did not have cardiac awareness.   Today, he denies symptoms of palpitations, chest pain, shortness of breath, orthopnea, PND, lower extremity edema, dizziness, presyncope, syncope, snoring, daytime somnolence, bleeding, or neurologic sequela. The patient is tolerating medications without difficulties and is otherwise without complaint today.    Atrial Fibrillation Risk Factors:  he does have symptoms or diagnosis of sleep apnea. he is compliant with CPAP therapy.   he has a BMI of Body mass index is 47.13 kg/m.Aaron Aas Filed Weights   01/28/24 1138  Weight: (!) 175.6 kg    Current Outpatient Medications  Medication Sig Dispense Refill   carisoprodol (SOMA) 350 MG tablet Take 175 mg by mouth 2 (two) times daily. 1400 & 1800     cetirizine (ZYRTEC) 10 MG tablet Take 10 mg by mouth daily.     docusate sodium (COLACE) 100 MG capsule Take 100 mg by mouth daily.     fluticasone (FLONASE) 50 MCG/ACT nasal spray PLACE TWO SPRAYS INTO BOTH NOSTRILS ONCE DAILY      HYDROcodone-acetaminophen (NORCO) 10-325 MG tablet Take 0.5-1 tablets by mouth 3 (three) times daily. 1 tablet in the morning, 0.5 tablet at 3 pm, 0.5 tablet at 6pm     ibuprofen (ADVIL,MOTRIN) 200 MG tablet Take 200 mg by mouth 2 (two) times daily.     lisinopril-hydrochlorothiazide (PRINZIDE,ZESTORETIC) 20-25 MG tablet Take 2 tablets by mouth daily.     Melatonin 5 MG CAPS Take 10 mg by mouth at bedtime.     methadone (DOLOPHINE) 10 MG tablet Take 10 mg by mouth 2 (two) times daily. 10 mg in the morning & 10 mg at 1800     Polyethylene Glycol 3350 (MIRALAX PO) Take 1 packet by mouth every morning.     tamsulosin (FLOMAX) 0.4 MG CAPS capsule Take 0.4 mg by mouth as needed.     traZODone (DESYREL) 100 MG tablet Take 100 mg by mouth at bedtime.     No current facility-administered medications for this encounter.    Atrial Fibrillation Management history:  Previous antiarrhythmic drugs: none Previous cardioversions: none Previous ablations: none Anticoagulation history: none   ROS- All systems are reviewed and negative except as per the HPI above.  Physical Exam: BP 120/82   Pulse 65   Ht 6\' 4"  (1.93 m)   Wt (!) 175.6 kg   BMI 47.13 kg/m   GEN: Well nourished, well developed in no acute distress NECK: No JVD; No carotid bruits CARDIAC: Regular rate and rhythm, no murmurs, rubs, gallops RESPIRATORY:  Clear to auscultation without rales, wheezing or rhonchi  ABDOMEN: Soft, non-tender, non-distended EXTREMITIES:  No edema; No deformity   EKG today demonstrates  Vent. rate 65 BPM PR interval 182 ms QRS duration 88 ms QT/QTcB 408/424 ms P-R-T axes -8 64 0 Sinus rhythm with Premature atrial complexes Otherwise normal ECG When compared with ECG of 06-Aug-2020 11:54, PREVIOUS ECG IS PRESEN  Echo N/A  ASSESSMENT & PLAN CHA2DS2-VASc Score = 2  The patient's score is based upon: CHF History: 0 HTN History: 1 Diabetes History: 0 Stroke History: 0 Vascular Disease History:  0 Age Score: 1 Gender Score: 0       ASSESSMENT AND PLAN: Paroxysmal Atrial Fibrillation (ICD10:  I48.0) - subclinical The patient's CHA2DS2-VASc score is 2, indicating a 2.2% annual risk of stroke.    He is currently in NSR. Education provided about Afib. His Apple watch rhythm strip on wife's watch does appear consistent with Afib. Discussion about triggers for Afib and medication treatments going forward if indicated. After discussion, we will mail cardiac monitor so patient can place after he returns from beach. He will wear 2 week monitor to assess for PAF burden. Recommended update in rhythm monitoring device such as newer Apple watch or [employer]. Will order baseline echocardiogram. We discussed weight loss to help possibly lower burden.   Secondary Hypercoagulable State (ICD10:  D68.69) The patient is at significant risk for stroke/thromboembolism based upon his CHA2DS2-VASc Score of 2.   We discussed guideline recommendations for anticoagulation and due to subclinical Afib will not begin OAC at this time. If he has multiple episodes / longer duration would qualify for [facility] given risk score of 2.     Follow up will be determined based on monitor results.    Minnie Amber, PA-C  [facility] [address] [place], [place] [address] [phone]

## 2024-01-28 NOTE — Addendum Note (Signed)
 Encounter addended by: Rich Champ, CMA on: 01/28/2024 3:54 PM  Actions taken: Order list changed, Diagnosis association updated

## 2024-01-29 ENCOUNTER — Inpatient Hospital Stay (HOSPITAL_COMMUNITY)
Admission: RE | Admit: 2024-01-29 | Discharge: 2024-01-29 | Disposition: A | Discharge status: Home / Self Care | Source: Ambulatory Visit | Attending: Internal Medicine | Admitting: Internal Medicine

## 2024-01-29 DIAGNOSIS — I4891 Unspecified atrial fibrillation: Secondary | ICD-10-CM

## 2024-02-24 DIAGNOSIS — I4891 Unspecified atrial fibrillation: Secondary | ICD-10-CM | POA: Diagnosis not present

## 2024-02-24 DIAGNOSIS — G4733 Obstructive sleep apnea (adult) (pediatric): Secondary | ICD-10-CM | POA: Diagnosis not present

## 2024-02-25 ENCOUNTER — Ambulatory Visit (HOSPITAL_COMMUNITY): Payer: Self-pay | Admitting: Internal Medicine

## 2024-02-25 NOTE — Addendum Note (Signed)
 Encounter addended by: Missouri Amor, RN on: 02/25/2024 11:10 AM  Actions taken: Imaging Exam ended

## 2024-03-10 ENCOUNTER — Ambulatory Visit (HOSPITAL_COMMUNITY)

## 2024-03-15 ENCOUNTER — Ambulatory Visit (HOSPITAL_COMMUNITY)
Admission: RE | Admit: 2024-03-15 | Discharge: 2024-03-15 | Disposition: A | Discharge status: Home / Self Care | Source: Ambulatory Visit | Attending: Internal Medicine | Admitting: Internal Medicine

## 2024-03-15 DIAGNOSIS — I4891 Unspecified atrial fibrillation: Secondary | ICD-10-CM | POA: Diagnosis not present

## 2024-03-15 LAB — ECHOCARDIOGRAM COMPLETE
Area-P 1/2: 3.45 cm2
Calc EF: 62.7 %
S' Lateral: 3.1 cm
Single Plane A2C EF: 55.4 %
Single Plane A4C EF: 64.1 %

## 2024-08-02 ENCOUNTER — Ambulatory Visit (HOSPITAL_COMMUNITY): Admitting: Internal Medicine
